# Patient Record
Sex: Female | Born: 2005 | Race: White | Hispanic: No | Marital: Single | Smoking: Never smoker
Health system: Southern US, Community
[De-identification: ages and names within clinical notes are randomized; demographics above are authoritative.]

## PROBLEM LIST (undated history)

## (undated) HISTORY — PX: DENTAL SURGERY: SHX609

---
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
## 2023-01-11 DIAGNOSIS — N3091 Cystitis, unspecified with hematuria: Secondary | ICD-10-CM | POA: Diagnosis not present

## 2023-01-11 DIAGNOSIS — N3001 Acute cystitis with hematuria: Secondary | ICD-10-CM | POA: Diagnosis not present

## 2023-01-25 DIAGNOSIS — H6501 Acute serous otitis media, right ear: Secondary | ICD-10-CM | POA: Diagnosis not present

## 2023-02-01 ENCOUNTER — Other Ambulatory Visit (INDEPENDENT_AMBULATORY_CARE_PROVIDER_SITE_OTHER): Payer: Self-pay

## 2023-02-01 ENCOUNTER — Ambulatory Visit (INDEPENDENT_AMBULATORY_CARE_PROVIDER_SITE_OTHER): Payer: Self-pay | Admitting: Neurology

## 2023-02-17 DIAGNOSIS — H6501 Acute serous otitis media, right ear: Secondary | ICD-10-CM | POA: Diagnosis not present

## 2023-02-17 DIAGNOSIS — H9201 Otalgia, right ear: Secondary | ICD-10-CM | POA: Diagnosis not present

## 2023-02-20 ENCOUNTER — Ambulatory Visit (INDEPENDENT_AMBULATORY_CARE_PROVIDER_SITE_OTHER): Payer: Self-pay | Admitting: Neurology

## 2023-03-23 ENCOUNTER — Telehealth (INDEPENDENT_AMBULATORY_CARE_PROVIDER_SITE_OTHER): Payer: Self-pay | Admitting: Neurology

## 2023-03-23 NOTE — Telephone Encounter (Signed)
Pharmacy was processing the wrong RX.  Error has been corrected on their end.  Contacted patient's mother to inform her of this. Mom verbalized understanding of this.  SS, CCMA

## 2023-03-23 NOTE — Telephone Encounter (Signed)
  Name of who is calling: Social research officer, government Relationship to Patient: Mom  Best contact number: [phone]  Provider they see: Dr. Merri Brunette  Reason for call: Pt needs refill on Kepra 500mg .      PRESCRIPTION REFILL ONLY  Name of prescription: Kepra 500mg    Pharmacy: [facility] in [place]

## 2023-04-04 DIAGNOSIS — H9201 Otalgia, right ear: Secondary | ICD-10-CM | POA: Diagnosis not present

## 2023-07-10 ENCOUNTER — Telehealth (INDEPENDENT_AMBULATORY_CARE_PROVIDER_SITE_OTHER): Payer: Self-pay

## 2023-07-10 NOTE — Telephone Encounter (Signed)
Form requesting Levetiracetam refill for a 90 d supply Last OV 12/11/2022 Next OV 08/13/2023 Rx written 12/11/22 with 8 refills to last until Nov appt.  Fax returned and advised will refill at Nov Ov if appropriate.

## 2023-07-29 ENCOUNTER — Emergency Department (HOSPITAL_COMMUNITY): Payer: BC Managed Care – PPO

## 2023-07-29 ENCOUNTER — Other Ambulatory Visit: Payer: Self-pay

## 2023-07-29 ENCOUNTER — Emergency Department (HOSPITAL_COMMUNITY)
Admission: EM | Admit: 2023-07-29 | Discharge: 2023-07-29 | Disposition: A | Discharge status: Home / Self Care | Payer: BC Managed Care – PPO | Attending: Emergency Medicine | Admitting: Emergency Medicine

## 2023-07-29 ENCOUNTER — Encounter (HOSPITAL_COMMUNITY): Payer: Self-pay | Admitting: Emergency Medicine

## 2023-07-29 DIAGNOSIS — R1084 Generalized abdominal pain: Secondary | ICD-10-CM | POA: Insufficient documentation

## 2023-07-29 DIAGNOSIS — R109 Unspecified abdominal pain: Secondary | ICD-10-CM | POA: Diagnosis not present

## 2023-07-29 LAB — COMPREHENSIVE METABOLIC PANEL
ALT: 13 U/L (ref 0–44)
AST: 20 U/L (ref 15–41)
Albumin: 4.6 g/dL (ref 3.5–5.0)
Alkaline Phosphatase: 45 U/L (ref 38–126)
Anion gap: 7 (ref 5–15)
BUN: 7 mg/dL (ref 6–20)
CO2: 22 mmol/L (ref 22–32)
Calcium: 9 mg/dL (ref 8.9–10.3)
Chloride: 110 mmol/L (ref 98–111)
Creatinine, Ser: 0.64 mg/dL (ref 0.44–1.00)
GFR, Estimated: >60 mL/min (ref >60)
Glucose, Bld: 102 mg/dL — ABNORMAL HIGH (ref 70–99)
Potassium: 3.7 mmol/L (ref 3.5–5.1)
Sodium: 139 mmol/L (ref 135–145)
Total Bilirubin: 0.4 mg/dL (ref 0.3–1.2)
Total Protein: 8 g/dL (ref 6.5–8.1)

## 2023-07-29 LAB — LIPASE, BLOOD: Lipase: 66 U/L — ABNORMAL HIGH (ref 11–51)

## 2023-07-29 LAB — URINALYSIS, ROUTINE W REFLEX MICROSCOPIC
Bilirubin Urine: NEGATIVE
Glucose, UA: NEGATIVE mg/dL
Hgb urine dipstick: NEGATIVE
Ketones, ur: NEGATIVE mg/dL
Leukocytes,Ua: NEGATIVE
Nitrite: NEGATIVE
Protein, ur: NEGATIVE mg/dL
Specific Gravity, Urine: 1.002 — ABNORMAL LOW (ref 1.005–1.030)
pH: 6 (ref 5.0–8.0)

## 2023-07-29 LAB — CBC
HCT: 43.1 % (ref 36.0–46.0)
Hemoglobin: 14.5 g/dL (ref 12.0–15.0)
MCH: 29.9 pg (ref 26.0–34.0)
MCHC: 33.6 g/dL (ref 30.0–36.0)
MCV: 88.9 fL (ref 80.0–100.0)
Platelets: 243 10*3/uL (ref 150–400)
RBC: 4.85 MIL/uL (ref 3.87–5.11)
RDW: 11.9 % (ref 11.5–15.5)
WBC: 5.9 10*3/uL (ref 4.0–10.5)
nRBC: 0 % (ref 0.0–0.2)

## 2023-07-29 LAB — HCG, SERUM, QUALITATIVE: Preg, Serum: NEGATIVE

## 2023-07-29 MED ORDER — IOHEXOL 300 MG/ML  SOLN
100.0000 mL | Freq: Once | INTRAMUSCULAR | Status: AC | PRN
  Administered 2023-07-29: 100 mL via INTRAVENOUS

## 2023-07-29 NOTE — ED Triage Notes (Signed)
Pt presents to ED with c/o generalized abd pain that began 1-2hrs ago, pt reports that pain started as a sharp pain around her umbilicus and radiated to her left side and back, then became generalized. Pt applied heat and did not get relief. Started Omeprazole for GERD on 10/29. Rates pain 8/10. Denies N/V, diarrhea, or any additional symptoms.

## 2023-07-29 NOTE — ED Provider Notes (Signed)
[facility] AT [facility] Provider Note   CSN: [id number] Arrival date & time: 07/29/23  0316     History  Chief Complaint  Patient presents with   Abdominal Pain    Veronica Trevino is a 17 y.o. female.  Patient with history of GERD recently started on omeprazole presents today with complaints of abdominal pain.  She states that same began pain was initially sharp around her umbilicus and radiated to the left side.  Denies any nausea, vomiting, or diarrhea.  She is having regular bowel movements.  No history of similar symptoms previously.  No history of abdominal surgeries.  She tried a heating pad without any relief.  Presents for same.  Unfortunately, due to extended wait times patient was in the lobby for approximately 5 hours before being placed in a room.  States that soon after she arrived here her pain completely resolved and has not returned.  She is back to her normal.  Denies any urinary symptoms.  The history is provided by the patient. No language interpreter was used.  Abdominal Pain      Home Medications Prior to Admission medications   Medication Sig Start Date End Date Taking? Authorizing Provider  ipratropium (ATROVENT) 0.06 % nasal spray Place 2 sprays into both nostrils 3 (three) times daily. Patient not taking: Reported on 12/11/2022 12/05/22   [provider]  LARIN 24 FE 1-20 MG-MCG(24) tablet Take 1 tablet by mouth daily. 07/14/22   [provider]  levETIRAcetam (KEPPRA) 500 MG tablet 1 Tablet in AM and 2 Tablets in Evening. 12/11/22   Keturah Shavers, MD  NAYZILAM 5 MG/0.1ML SOLN Apply 5 mg nasally for seizures lasting longer than 5 minutes. 02/10/22   Keturah Shavers, MD      Allergies    Patient has no known allergies.    Review of Systems   Review of Systems  Gastrointestinal:  Positive for abdominal pain.  All other systems reviewed and are negative.   Physical Exam Updated Vital Signs BP 100/75    Pulse 81   Temp 98.5 F (36.9 C) (Oral)   Resp 14   Ht 5\' 3"  (1.6 m)   Wt 47.6 kg   LMP 07/09/2023 (Exact Date)   SpO2 100%   BMI 18.60 kg/m  Physical Exam Vitals and nursing note reviewed.  Constitutional:      General: She is not in acute distress.    Appearance: Normal appearance. She is normal weight. She is not ill-appearing, toxic-appearing or diaphoretic.  HENT:     Head: Normocephalic and atraumatic.  Cardiovascular:     Rate and Rhythm: Normal rate.  Pulmonary:     Effort: Pulmonary effort is normal. No respiratory distress.  Abdominal:     General: Abdomen is flat.     Palpations: Abdomen is soft.     Tenderness: There is no abdominal tenderness.  Musculoskeletal:        General: Normal range of motion.     Cervical back: Normal range of motion.  Skin:    General: Skin is warm and dry.  Neurological:     General: No focal deficit present.     Mental Status: She is alert.  Psychiatric:        Mood and Affect: Mood normal.        Behavior: Behavior normal.     ED Results / Procedures / Treatments   Labs (all labs ordered are listed, but only abnormal results are displayed)  Labs Reviewed  LIPASE, BLOOD - Abnormal; Notable for the following components:      Result Value   Lipase 66 (*)    All other components within normal limits  COMPREHENSIVE METABOLIC PANEL - Abnormal; Notable for the following components:   Glucose, Bld 102 (*)    All other components within normal limits  URINALYSIS, ROUTINE W REFLEX MICROSCOPIC - Abnormal; Notable for the following components:   Color, Urine COLORLESS (*)    Specific Gravity, Urine 1.002 (*)    All other components within normal limits  CBC  HCG, SERUM, QUALITATIVE    EKG None  Radiology CT ABDOMEN PELVIS W CONTRAST  Result Date: 07/29/2023 CLINICAL DATA:  Acute, nonlocalized abdominal pain EXAM: CT ABDOMEN AND PELVIS WITH CONTRAST TECHNIQUE: Multidetector CT imaging of the abdomen and pelvis was performed  using the standard protocol following bolus administration of intravenous contrast. RADIATION DOSE REDUCTION: This exam was performed according to the departmental dose-optimization program which includes automated exposure control, adjustment of the mA and/or kV according to patient size and/or use of iterative reconstruction technique. CONTRAST:  OMNIPAQUE IOHEXOL 300 MG/ML  SOLN COMPARISON:  None Available. FINDINGS: Lower chest:  No contributory findings. Hepatobiliary: No focal liver abnormality.No evidence of biliary obstruction or stone. Pancreas: Unremarkable. Spleen: Unremarkable. Adrenals/Urinary Tract: Negative adrenals. No hydronephrosis or stone. Unremarkable bladder. Stomach/Bowel: No obstruction. The appendix is difficult to visualize separate from adjacent small bowel loops, but no indication of appendicitis. Vascular/Lymphatic: No acute vascular abnormality. No mass or adenopathy. Reproductive:No pathologic findings. Other: No ascites or pneumoperitoneum. Musculoskeletal: No acute abnormalities. IMPRESSION: Negative abdominal CT.  No explanation for symptoms. Electronically Signed   By: Tiburcio Pea M.D.   On: 07/29/2023 05:55    Procedures Procedures    Medications Ordered in ED Medications  iohexol (OMNIPAQUE) 300 MG/ML solution 100 mL (100 mLs Intravenous Contrast Given 07/29/23 0534)    ED Course/ Medical Decision Making/ A&P                                 Medical Decision Making Amount and/or Complexity of Data Reviewed Labs: ordered.   This patient is a 17 y.o. female who presents to the ED for concern of abdominal pain, this involves an extensive number of treatment options, and is a complaint that carries with it a high risk of complications and morbidity. The emergent differential diagnosis prior to evaluation includes, but is not limited to,  gastroenteritis, appendicitis, Bowel obstruction, Bowel perforation. Gastroparesis, DKA, Hernia, Inflammatory bowel  disease, mesenteric ischemia, pancreatitis, peritonitis SBP, volvulus.  This is not an exhaustive differential.   Past Medical History / Co-morbidities / Social History:  has a past medical history of Seizure disorder ([facility]) (01/15/2022).  Additional history: Chart reviewed.  Physical Exam: Physical exam performed. The pertinent findings include: Abdomen soft and nontender  Lab Tests: I ordered, and personally interpreted labs.  The pertinent results include:  Lipase 66, no other acute laboratory abnormalities   Imaging Studies: I ordered imaging studies including CT abdomen pelvis. I independently visualized and interpreted imaging which showed   Negative abdominal CT. No explanation for symptoms.   I agree with the radiologist interpretation.     Medications: Considered medications for symptoms, however upon assessment patient's pain had already completely resolved.  Disposition: After consideration of the diagnostic results and the patients response to treatment, I feel that emergency department workup does not suggest an emergent  condition requiring admission or immediate intervention beyond what has been performed at this time. The plan is: Discharge with close outpatient follow-up and return precautions. Patient is nontoxic, nonseptic appearing, in no apparent distress.  Prior to my evaluation, patient has been asymptomatic for several hours.  Labs, imaging and vitals reviewed.  Patient does not meet the SIRS or Sepsis criteria.   She feels ready to go home.  No medications needed.  Evaluation and diagnostic testing in the emergency department does not suggest an emergent condition requiring admission or immediate intervention beyond what has been performed at this time.  Plan for discharge with close PCP follow-up.  Patient is understanding and amenable with plan, educated on red flag symptoms that would prompt immediate return.  Patient discharged in stable condition.  Final  Clinical Impression(s) / ED Diagnoses Final diagnoses:  Generalized abdominal pain    Rx / DC Orders ED Discharge Orders     None     An After Visit Summary was printed and given to the patient.     Vear Clock 07/29/23 7829    Tegeler, Canary Brim, MD 07/29/23 1536

## 2023-07-29 NOTE — Discharge Instructions (Signed)
As we discussed, your workup in the ER today was reassuring for acute findings.  Laboratory evaluation and CT imaging did not reveal any emergent cause of your symptoms.  Given that your symptoms have resolved, no additional evaluation is indicated.  I recommend that you follow-up closely with your primary doctor.   Return if development of any new or worsening symptoms.

## 2023-08-09 DIAGNOSIS — R1084 Generalized abdominal pain: Secondary | ICD-10-CM | POA: Diagnosis not present

## 2023-08-13 ENCOUNTER — Ambulatory Visit (INDEPENDENT_AMBULATORY_CARE_PROVIDER_SITE_OTHER): Payer: BC Managed Care – PPO | Admitting: Neurology

## 2023-08-13 ENCOUNTER — Encounter (INDEPENDENT_AMBULATORY_CARE_PROVIDER_SITE_OTHER): Payer: Self-pay | Admitting: Neurology

## 2023-08-13 VITALS — BP 118/60 | HR 62 | Ht 63.58 in | Wt 106.5 lb

## 2023-08-13 DIAGNOSIS — R55 Syncope and collapse: Secondary | ICD-10-CM

## 2023-08-13 DIAGNOSIS — G40909 Epilepsy, unspecified, not intractable, without status epilepticus: Secondary | ICD-10-CM | POA: Diagnosis not present

## 2023-08-13 MED ORDER — LEVETIRACETAM 500 MG PO TABS: ORAL_TABLET | ORAL | 2 refills | Status: DC

## 2023-08-13 NOTE — Patient Instructions (Signed)
Continue the same dose of Keppra at 500 mg in a.m. and 1000 mg in p.m. Continue with adequate sleep and limited screen time Follow-up with your primary care physician for increased lipase Call my office if there is any seizure activity We will schedule for sleep deprived EEG at the same time with next visit Return in 7 months for follow-up visit

## 2023-08-13 NOTE — Progress Notes (Signed)
Patient: Veronica Trevino MRN: [id number] Sex: female DOB: 06/09/2006  Provider: Keturah Shavers, MD Location of Care: [facility]  Note type: Routine return visit  Referral Source: PCP History from: patient, [facility] chart, and MOM AND DAD Chief Complaint: Seizure disorder (HCC   History of Present Illness: Veronica Trevino is a 17 y.o. female is here for follow-up management of seizure disorder. 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  She was also having episodes which more look like to be vasovagal event with the last episode was prior to the last visit in March. She has been on Keppra with moderate dose and with fairly good seizure control and tolerating medication well with no side effects, currently 500 mg in a.m. and 1000 mg in p.m. Her last EEG in March 2024 was slightly abnormal with 1 brief generalized sharply contoured waves She did have a normal head CT in April 2023 Overall since her last visit in March she has been doing very well without having any episodes concerning for seizure activity or vasovagal event and she has been taking Keppra regularly without any missing doses.   She has had some abdominal pain for which she was seen in emergency room and had slight elevation of lipase which repeated again and it was slightly higher at around 90 as per parents.  She has not had any abdominal pain over the past few days.  Review of Systems: Review of system as per HPI, otherwise negative.  Past Medical History:  Diagnosis Date   Seizure disorder (HCC) 01/15/2022   1 witnessed generalized tonic-clonic seizure with abnormal EEG.  Now on Keppra.   Hospitalizations: No., Head Injury: No., Nervous System Infections: No., Immunizations up to date: Yes.     Surgical History Past Surgical History:  Procedure Laterality Date    DENTAL SURGERY      Family History family history includes Healthy in her father; Peripheral Artery Disease in her mother; Pulmonary Hypertension in her mother; Seizures in her brother; Supraventricular tachycardia in her mother.   Social History Social History   Socioeconomic History   Marital status: Single    Spouse name: Not on file   Number of children: Not on file   Years of education: Not on file   Highest education level: High school graduate  Occupational History   Not on file  Tobacco Use   Smoking status: Never    Passive exposure: Never   Smokeless tobacco: Never  Vaping Use   Vaping status: Never Used  Substance and Sexual Activity   Alcohol use: Never   Drug use: Never   Sexual activity: Never  Other Topics Concern   Not on file  Social History Narrative   Graduated (2023), [employer]   Patient lives with: Mom, Step Father. 2 Brothers and Sister      What are the patient's hobbies or interest? Reading.    No Employment.           Social Determinants of Health   Financial Resource Strain: Not on file  Food Insecurity: Low Risk  (04/04/2023)   Received from [facility]   Hunger Vital Sign    Worried About Running Out of Food in the Last Year: Never true    Ran Out of Food in the Last Year: Never true  Transportation Needs: Not on file (04/04/2023)  Physical Activity: Not on file  Stress: Not on file  Social Connections: Not on file     No Known Allergies  Physical Exam BP 118/60   Pulse 62   Ht 5' 3.58" (1.615 m)   Wt 106 lb 7.7 oz (48.3 kg)   LMP 08/10/2023   BMI 18.52 kg/m  Gen: Awake, alert, not in distress Skin: No rash, No neurocutaneous stigmata. HEENT: Normocephalic, no dysmorphic features, no conjunctival injection, nares patent, mucous membranes moist, oropharynx clear. Neck: Supple, no meningismus. No focal tenderness. Resp: Clear to auscultation bilaterally CV: Regular rate, normal S1/S2, no murmurs, no  rubs Abd: BS present, abdomen soft, non-tender, non-distended. No hepatosplenomegaly or mass Ext: Warm and well-perfused. No deformities, no muscle wasting, ROM full.  Neurological Examination: MS: Awake, alert, interactive. Normal eye contact, answered the questions appropriately, speech was fluent,  Normal comprehension.  Attention and concentration were normal. Cranial Nerves: Pupils were equal and reactive to light ( 5-54mm);  normal fundoscopic exam with sharp discs, visual field full with confrontation test; EOM normal, no nystagmus; no ptsosis, no double vision, intact facial sensation, face symmetric with full strength of facial muscles, hearing intact to finger rub bilaterally, palate elevation is symmetric, tongue protrusion is symmetric with full movement to both sides.  Sternocleidomastoid and trapezius are with normal strength. Tone-Normal Strength-Normal strength in all muscle groups DTRs-  Biceps Triceps Brachioradialis Patellar Ankle  R 2+ 2+ 2+ 2+ 2+  L 2+ 2+ 2+ 2+ 2+   Plantar responses flexor bilaterally, no clonus noted Sensation: Intact to light touch, temperature, vibration, Romberg negative. Coordination: No dysmetria on FTN test. No difficulty with balance. Gait: Normal walk and run. Tandem gait was normal. Was able to perform toe walking and heel walking without difficulty.   Assessment and Plan 1. Seizure disorder (HCC)   2. Vasovagal episode    This is a 17 year old female with diagnosis of generalized seizure disorder since 2023, currently on moderate dose of Keppra with good seizure control and no side effects.  She has no other medical issues although she did have some abdominal pain with slight elevation of lipase. Recommend to continue the same dose of Keppra at 500 mg in a.m. and 1000 mg in p.m. She will continue with adequate sleep and limited screen time She will continue follow-up with primary care physician to follow-up on increased lipase for further  testing or referral to GI service We will schedule for a follow-up EEG with the next appointment If she continues to be seizure-free and her next EEG is normal then we may consider tapering and discontinue medication I would like to see her in 7 months for follow-up visit and we will discuss further treatment based on the clinical response and next EEG report.  She and both parents understood and agreed with the plan.  Meds ordered this encounter  Medications   levETIRAcetam (KEPPRA) 500 MG tablet    Sig: 1 Tablet in AM and 2 Tablets in Evening.    Dispense:  270 tablet    Refill:  2   Orders Placed This Encounter  Procedures   Child sleep deprived EEG    Standing Status:   Future    Standing Expiration Date:   08/12/2024    Scheduling Instructions:     To be done at the same time with a next appointment in 7 months    Order Specific Question:   Where should this test be performed?    Answer:   [facility]

## 2023-08-27 ENCOUNTER — Other Ambulatory Visit (INDEPENDENT_AMBULATORY_CARE_PROVIDER_SITE_OTHER): Payer: Self-pay | Admitting: Neurology

## 2023-08-27 MED ORDER — NAYZILAM 5 MG/0.1ML NA SOLN: NASAL | 2 refills | Status: AC

## 2023-08-27 NOTE — Telephone Encounter (Signed)
  Name of who is calling: Veronica Trevino   Caller's Relationship to Patient:   Best contact number: [phone]  Provider they see: Dr. Merri Brunette  Reason for call: Pt called in stating she needs med refill on emergency seizure medication, I asked her if it was the nose spray and she said she thinks so, does not know the name of the medication. Please send to [facility] on [address]     PRESCRIPTION REFILL ONLY  Name of prescription:  Pharmacy:

## 2023-08-28 ENCOUNTER — Encounter (INDEPENDENT_AMBULATORY_CARE_PROVIDER_SITE_OTHER): Payer: Self-pay

## 2023-08-28 NOTE — Progress Notes (Signed)
Nayzilam Outcome Approved today by [employer] 2017 CaseId:[id number];Status:Approved;Review Type:Prior Auth;Coverage Start Date:07/29/2023;Coverage End Date:08/27/2024; Authorization Expiration Date: 08/26/2024

## 2023-09-10 ENCOUNTER — Encounter (INDEPENDENT_AMBULATORY_CARE_PROVIDER_SITE_OTHER): Payer: BC Managed Care – PPO | Admitting: Family

## 2023-10-12 ENCOUNTER — Telehealth (INDEPENDENT_AMBULATORY_CARE_PROVIDER_SITE_OTHER): Payer: Self-pay | Admitting: Neurology

## 2023-10-12 NOTE — Telephone Encounter (Signed)
Who's calling (name and relationship to patient) : Veronica Trevino; self   Best contact number: [phone]  Provider they see: Dr. Merri Brunette  Reason for call: Concepsion was calling in wanting to know if she can take herbal substance with the medication that she is on. Its  for gas and bloat relief; with Peppermint leaves, lemon, and sennel. She is requesting a call back.    Call ID:      PRESCRIPTION REFILL ONLY  Name of prescription:  Pharmacy:

## 2023-10-12 NOTE — Telephone Encounter (Signed)
Called Lalisa to let her know I received her message and I will be sending it over to Dr. Merri Brunette to see what he will say.  She understood message

## 2023-10-15 NOTE — Telephone Encounter (Signed)
Left message stating to give me a call back

## 2023-10-15 NOTE — Telephone Encounter (Signed)
Called Veronica Trevino to let her know per Dr. Merri Brunette hes not able to comment on any herbal medications or supplements since there is no study for that but overall she can try it for a few days and see if there would be any symptoms or intolerance otherwise she can take it but again we do not know specific benefits or side effects of these supplements.   Veronica Trevino understood message

## 2023-10-15 NOTE — Telephone Encounter (Signed)
Veronica Trevino is returning a callback from a voicemail that she reciceved and is requesting a callback at [phone].

## 2023-12-17 IMAGING — DX DG CHEST 1V PORT
1 series · 1 of 1 positions shown · non-contrast
Comparison: 03/14/2020

CLINICAL DATA: Status post fall with chest pain.

EXAM:
PORTABLE CHEST 1 VIEW

[chest]
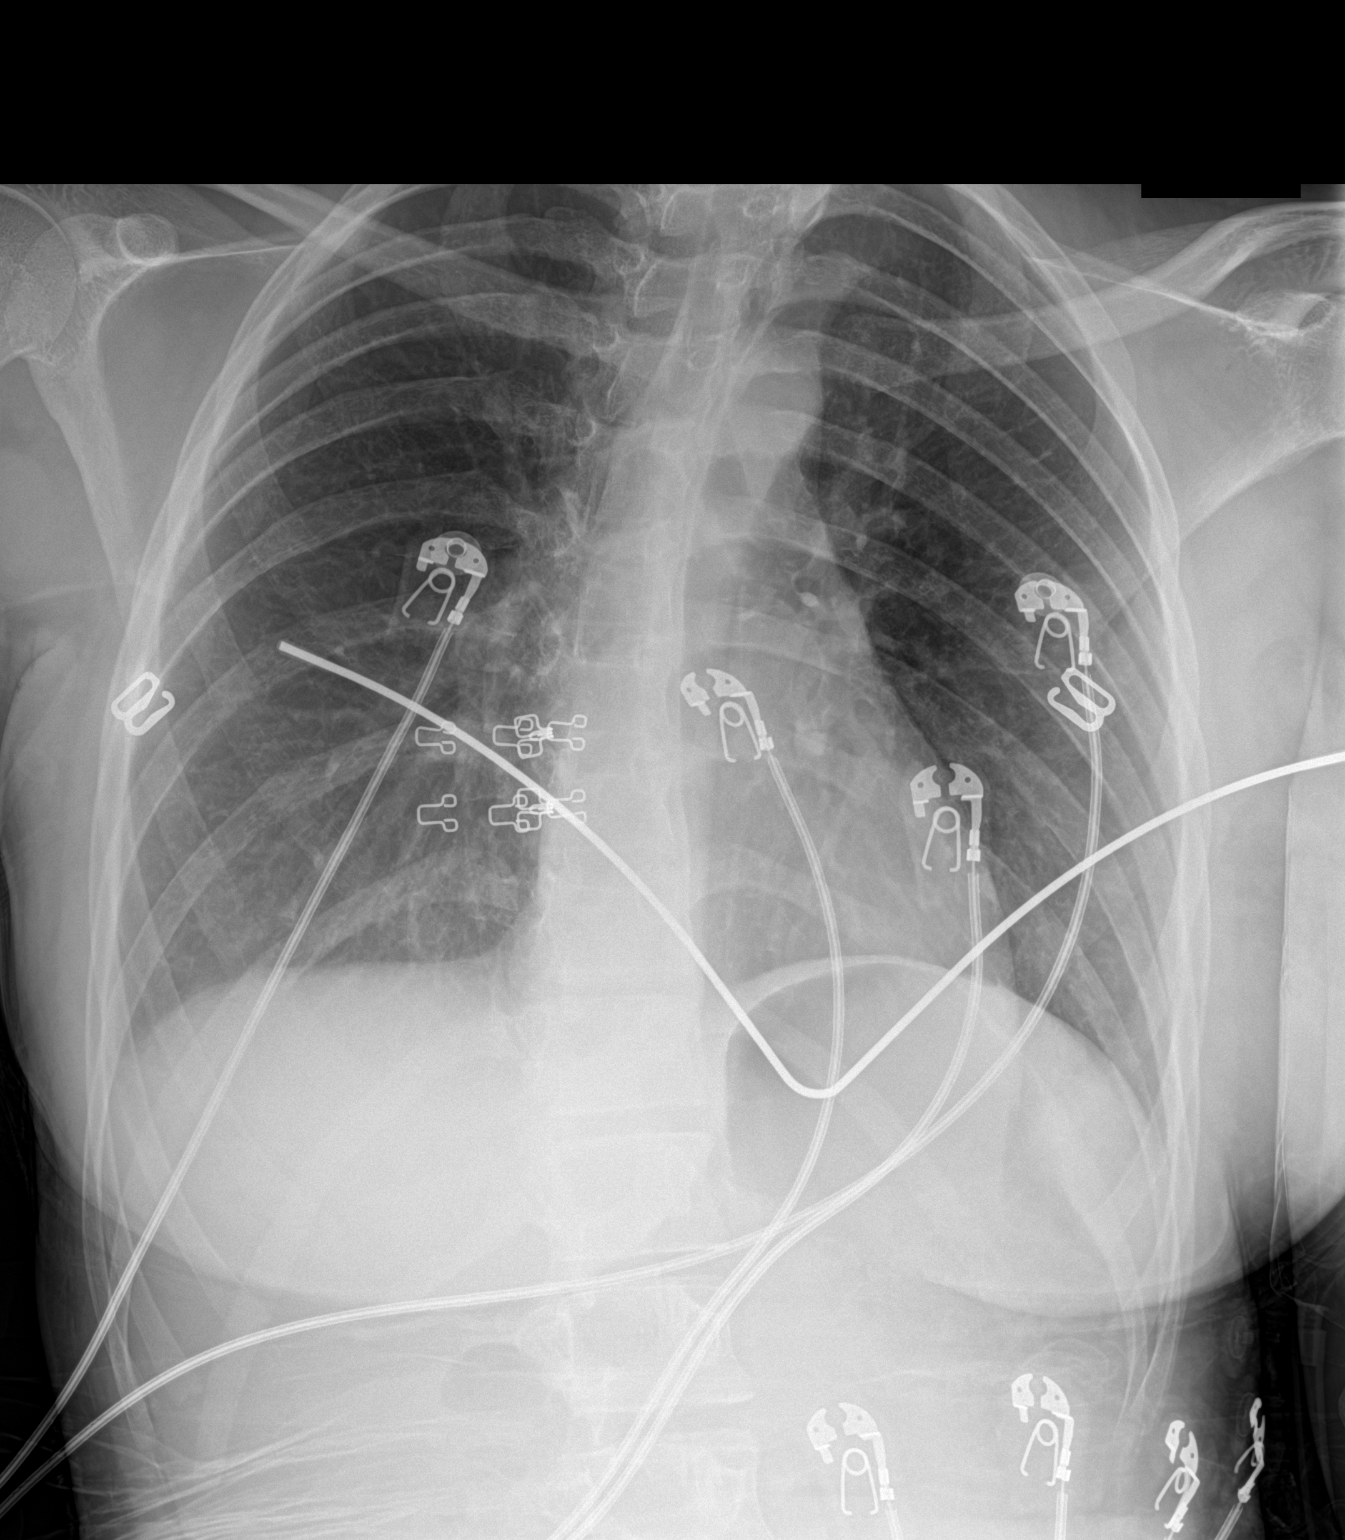

[1 of 1 positions shown; findings below may reference images not displayed]

FINDINGS: Cardiomediastinal silhouette is normal. Mediastinal contours appear
intact.

There is no evidence of focal airspace consolidation, pleural
effusion or pneumothorax.

Osseous structures are without acute abnormality. Soft tissues are
grossly normal.
IMPRESSION: No active disease.

## 2023-12-17 IMAGING — CT CT HEAD W/O CM
3 series · 15 of 37 positions shown, 17 images · non-contrast
Comparison: None.

CLINICAL DATA: Head trauma.



[Series 2: head wo · axial · 0.31mm/px · z∈[-209,-126]mm · 6 of 30 slices shown, 8 images]
[im 5/30  brain]
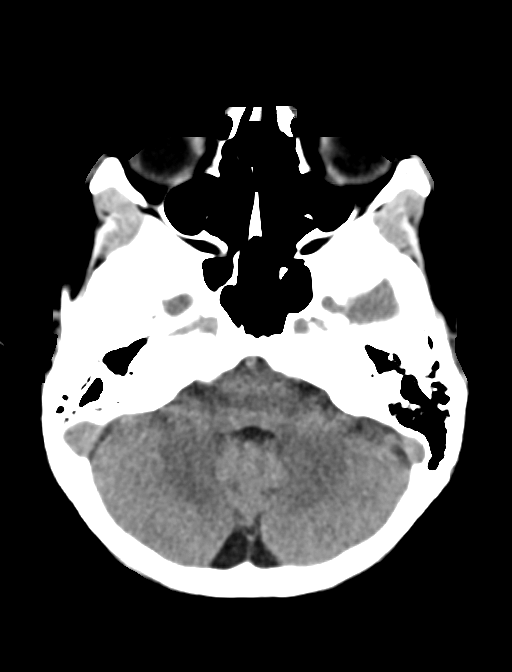
[im 5/30  bone]
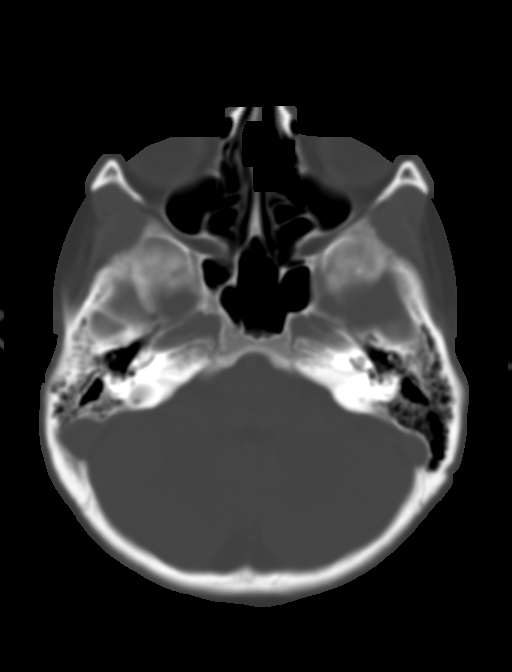
[im 9/30  brain]
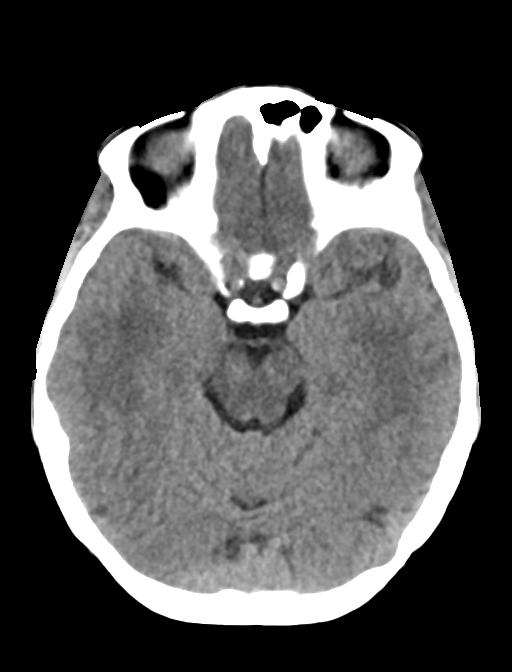
[im 13/30  brain]
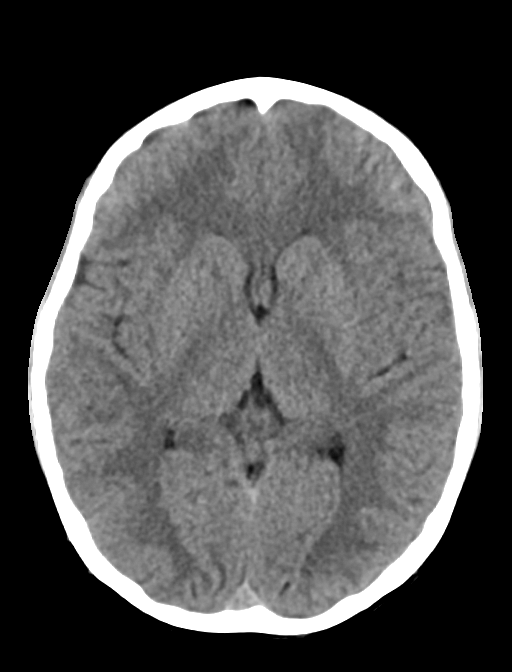
[im 17/30  brain]
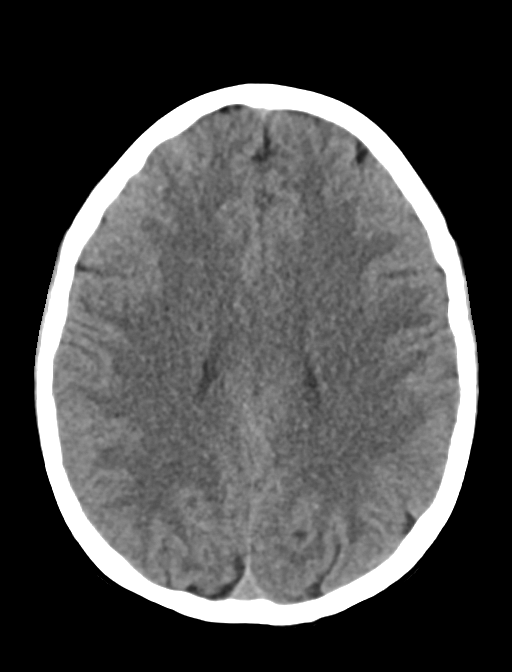
[im 21/30  brain]
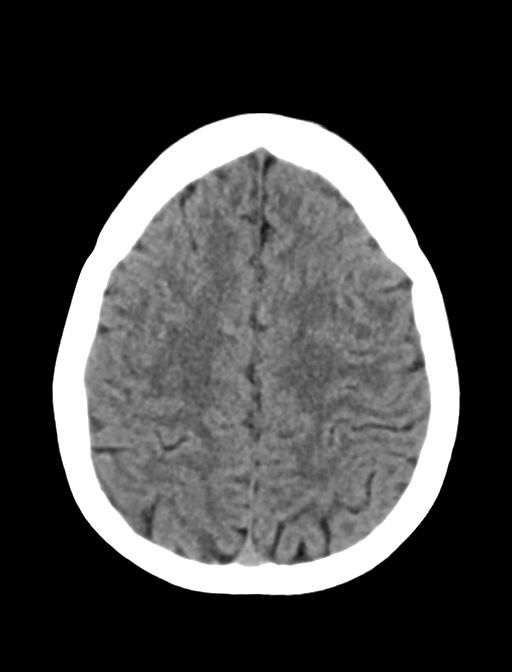
[im 21/30  bone]
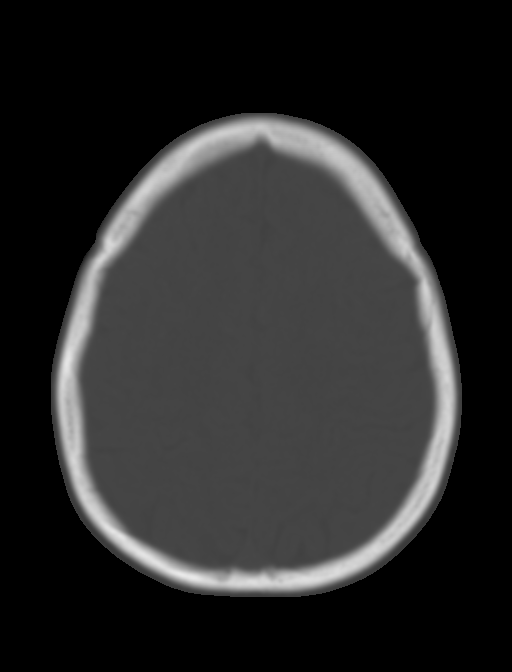
[im 25/30  brain]
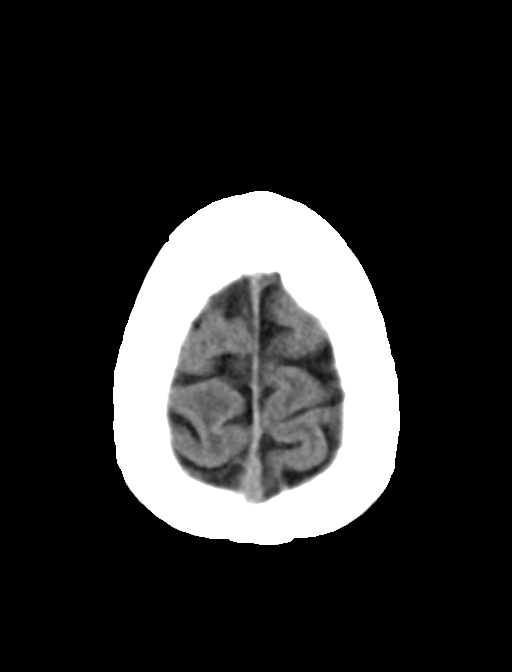

[Series 3: head bone · axial · 0.41mm/px · z∈[-172,-76]mm · 6 of 85 slices shown]
[im 9/85  bone]
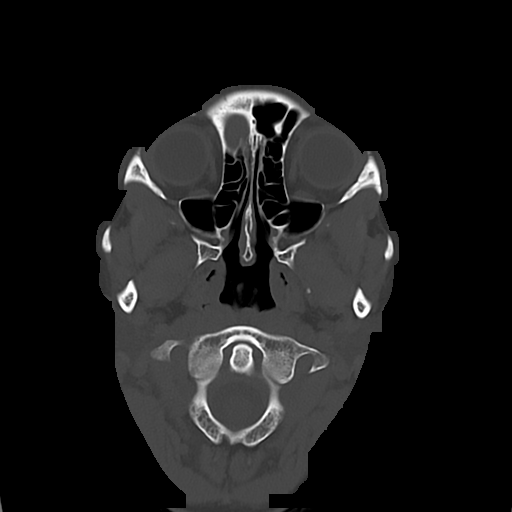
[im 17/85  bone]
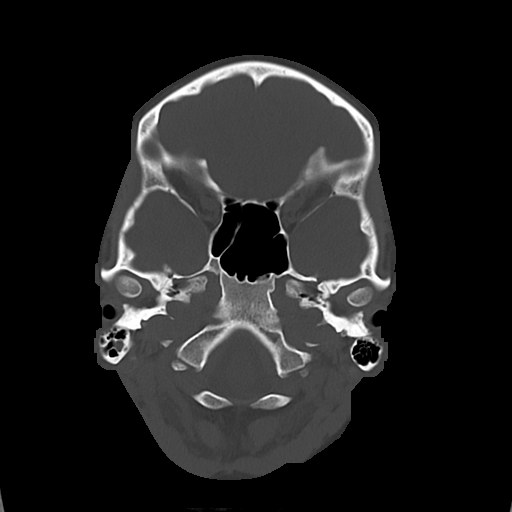
[im 29/85  bone]
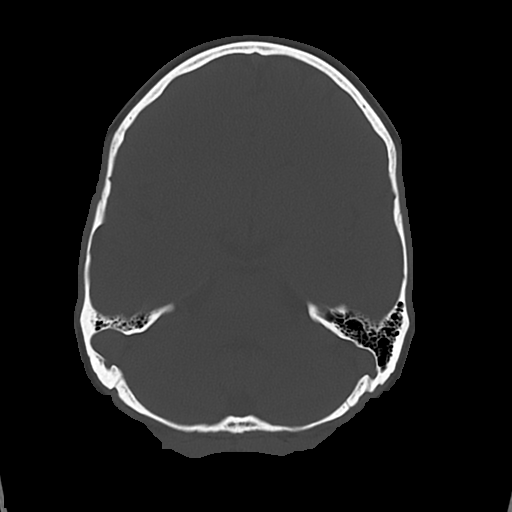
[im 37/85  bone]
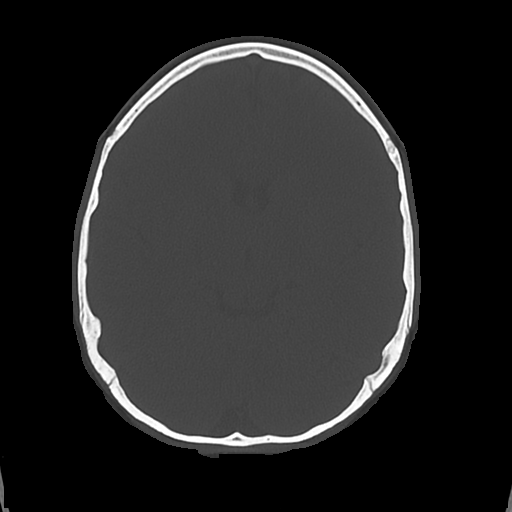
[im 49/85  bone]
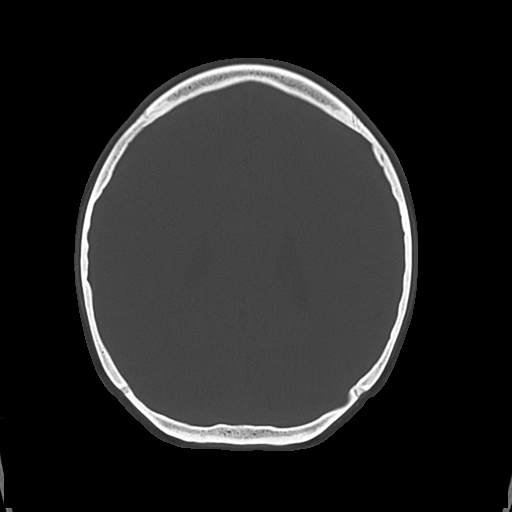
[im 57/85  bone]
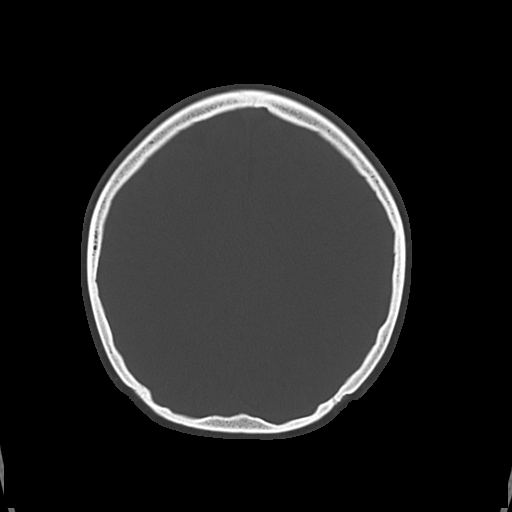

[Series 5: sag soft · sagittal · 0.33mm/px · 3 of 54 slices shown]
[im 18/54  brain]
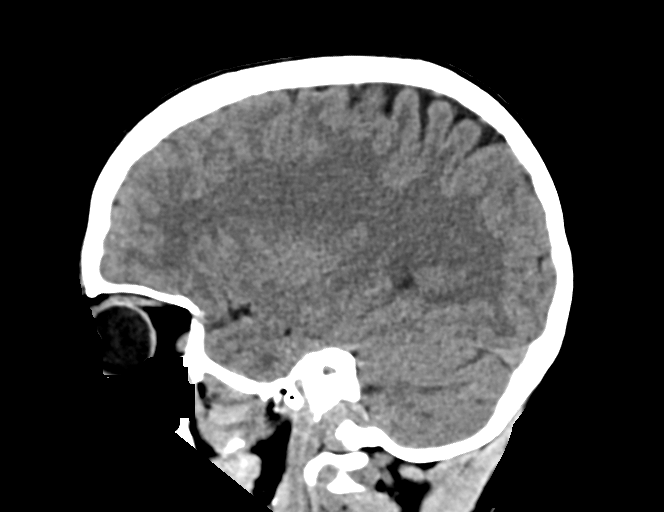
[im 27/54  brain]
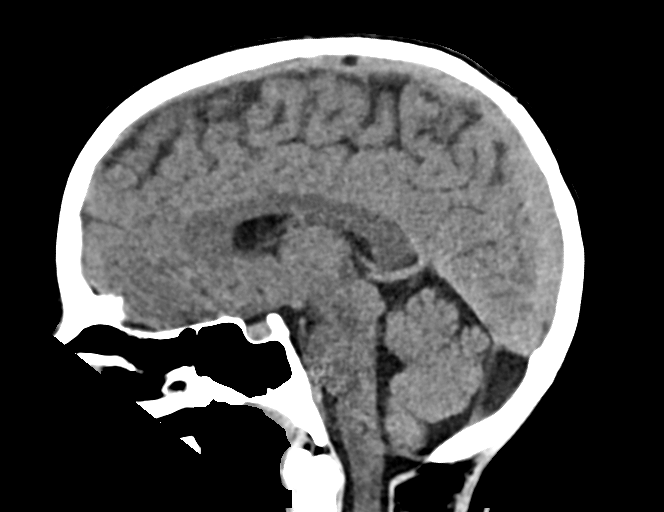
[im 36/54  brain]
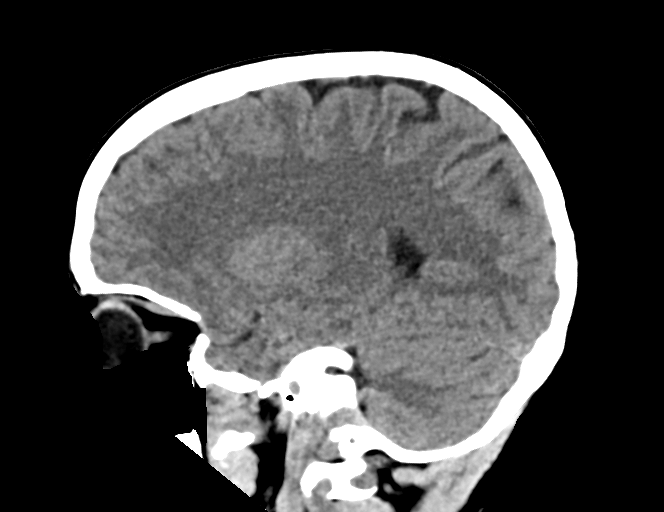

[15 of 37 positions shown; findings below may reference images not displayed]

FINDINGS: Brain: No evidence of acute infarction, hemorrhage, hydrocephalus,
extra-axial collection or mass lesion/mass effect.

Vascular: No hyperdense vessel or unexpected calcification.

Skull: Normal. Negative for fracture or focal lesion.

Sinuses/Orbits: No acute finding.

Other: None.
IMPRESSION: No acute intracranial abnormality.

## 2024-01-04 ENCOUNTER — Telehealth (INDEPENDENT_AMBULATORY_CARE_PROVIDER_SITE_OTHER): Payer: Self-pay | Admitting: Neurology

## 2024-01-04 NOTE — Telephone Encounter (Signed)
 Called Natoya stating Dr. Merri Brunette hasn't seen her for headache she can make appointment for it. Stay hydrated and take Advil 400mg  3-4 times a week.  She stated she sees him in June so she will mention headache then and be treated for it  Brianda and I understood message

## 2024-01-04 NOTE — Telephone Encounter (Signed)
 I called Veronica Trevino about having headaches. She says she's having daily headaches, it stops for a while and then comes back the very next day. She said she's been taking tylenol but no other headache meds and they dont seem to be helping. I let her know I would send message to Dr. Merri Brunette once he responds back to me on what she needs to do moving forward I will give her a call back with instructions.  Veronica Trevino understood message

## 2024-01-04 NOTE — Telephone Encounter (Signed)
  Name of who is calling: Jermeka   Caller's Relationship to Patient: self   Best contact number: [phone]  Provider they see: nab   Reason for call: a lot headaches recently, would like to speak with nurse or provider.      PRESCRIPTION REFILL ONLY  Name of prescription:  Pharmacy:

## 2024-02-15 ENCOUNTER — Emergency Department (HOSPITAL_COMMUNITY)
Admission: EM | Admit: 2024-02-15 | Discharge: 2024-02-15 | Disposition: A | Discharge status: Home / Self Care | Attending: Emergency Medicine | Admitting: Emergency Medicine

## 2024-02-15 ENCOUNTER — Other Ambulatory Visit: Payer: Self-pay

## 2024-02-15 ENCOUNTER — Emergency Department (HOSPITAL_COMMUNITY)

## 2024-02-15 DIAGNOSIS — R569 Unspecified convulsions: Secondary | ICD-10-CM | POA: Insufficient documentation

## 2024-02-15 DIAGNOSIS — R519 Headache, unspecified: Secondary | ICD-10-CM | POA: Diagnosis not present

## 2024-02-15 DIAGNOSIS — R42 Dizziness and giddiness: Secondary | ICD-10-CM | POA: Insufficient documentation

## 2024-02-15 DIAGNOSIS — R55 Syncope and collapse: Secondary | ICD-10-CM | POA: Insufficient documentation

## 2024-02-15 DIAGNOSIS — R29818 Other symptoms and signs involving the nervous system: Secondary | ICD-10-CM | POA: Diagnosis not present

## 2024-02-15 DIAGNOSIS — G40909 Epilepsy, unspecified, not intractable, without status epilepticus: Secondary | ICD-10-CM

## 2024-02-15 LAB — CBC
HCT: 44.1 % (ref 36.0–46.0)
Hemoglobin: 14.3 g/dL (ref 12.0–15.0)
MCH: 29.5 pg (ref 26.0–34.0)
MCHC: 32.4 g/dL (ref 30.0–36.0)
MCV: 91.1 fL (ref 80.0–100.0)
Platelets: 193 10*3/uL (ref 150–400)
RBC: 4.84 MIL/uL (ref 3.87–5.11)
RDW: 12.3 % (ref 11.5–15.5)
WBC: 8.7 10*3/uL (ref 4.0–10.5)
nRBC: 0 % (ref 0.0–0.2)

## 2024-02-15 LAB — HCG, SERUM, QUALITATIVE: Preg, Serum: NEGATIVE

## 2024-02-15 LAB — CBG MONITORING, ED: Glucose-Capillary: 61 mg/dL — ABNORMAL LOW (ref 70–99)

## 2024-02-15 MED ORDER — ACETAMINOPHEN 500 MG PO TABS
1000.0000 mg | ORAL_TABLET | Freq: Once | ORAL | Status: DC
  Filled 2024-02-15: qty 2

## 2024-02-15 MED ORDER — ONDANSETRON 4 MG PO TBDP: 4.0000 mg | ORAL_TABLET | ORAL | 0 refills | Status: DC | PRN

## 2024-02-15 MED ORDER — GADOBUTROL 1 MMOL/ML IV SOLN
5.0000 mL | Freq: Once | INTRAVENOUS | Status: AC | PRN
Start: 2024-02-15 — End: 2024-02-15
  Administered 2024-02-15: 5 mL via INTRAVENOUS

## 2024-02-15 MED ORDER — MECLIZINE HCL 25 MG PO TABS: 25.0000 mg | ORAL_TABLET | Freq: Three times a day (TID) | ORAL | 0 refills | Status: AC | PRN

## 2024-02-15 MED ORDER — MECLIZINE HCL 25 MG PO TABS: 25.0000 mg | ORAL_TABLET | Freq: Three times a day (TID) | ORAL | 0 refills | Status: DC | PRN

## 2024-02-15 NOTE — ED Notes (Signed)
Pt tx to MRI

## 2024-02-15 NOTE — Discharge Instructions (Addendum)
 1.  Follow-up with your neurologist and/or family doctor for recheck. 2.  At this time appears likely you have vertigo.  MRI did not show any evidence of a stroke, tumor or clot in your brain.  Vertigo can come and go.  You may try Antivert  (meclizine ) as prescribed for spinning like dizziness.  You may also take Zofran  for nausea if needed. 3.  Return to the emergency department if you have sudden changing or concerning symptoms such as double vision, passing out, weakness or numbness of your extremities or other concerning changes.

## 2024-02-15 NOTE — ED Notes (Signed)
 Lying: 128/73                96 Sitting: 124/86                69 Standing: 131/85                   84  Pt did get dizzy changing positions, did not feel like she was going to pass out of anything.

## 2024-02-15 NOTE — ED Triage Notes (Addendum)
 Pt BIBA from work. C/o sudden onset dizziness (room spinning) around 1430. Mild HA.  Hr 70-90s w/EMS  Aox4, no other focal deficits noted

## 2024-02-15 NOTE — ED Notes (Signed)
CBG 61. 

## 2024-02-15 NOTE — ED Notes (Signed)
 Pt remains in MRI

## 2024-02-15 NOTE — ED Provider Notes (Signed)
 [place] EMERGENCY DEPARTMENT AT [facility] Provider Note   CSN: [id number] Arrival date & time: 02/15/24  1505     History  Chief Complaint  Patient presents with   Dizziness    Veronica Trevino is a 18 y.o. female.  HPI She was at work.  She is a Solicitor at [employer].  She had been standing at the [employer] for a number of hours.  She reports that she had a sudden onset of feeling very dizzy.  She reports there was a spinning quality to the dizziness.  She denies any change in vision.  She did have onset of a diffuse headache which she qualifies as mild.  She reports it was not sudden onset of headache or severe.  She did not have focal weakness numbness or tingling of the extremities.  She reports she was able to ambulate but was off balance.  She did not fall or have a syncopal episode.  No chest pain or shortness of breath.  Patient reports she has recently been well without fevers or chills.  No recent cough nasal congestion or sinus pressure.  She does have a history of seizure disorder.  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  She denies that this is any typical presentation for prodrome for her.  She has no history of migraine headaches.  Patient takes Keppra  with which she is compliant.  She takes birth control pills.    Home Medications Prior to Admission medications   Medication Sig Start Date End Date Taking? Authorizing Provider  meclizine (ANTIVERT) 25 MG tablet Take 1 tablet (25 mg total) by mouth 3 (three) times daily as needed for dizziness. 02/15/24  Yes Wynetta Heckle, MD  ipratropium (ATROVENT) 0.06 % nasal spray Place 2 sprays into both nostrils 3 (three) times daily. Patient not taking: Reported on 12/11/2022 12/05/22   [provider]  LARIN 24 FE 1-20 MG-MCG(24) tablet Take 1 tablet by mouth daily. 07/14/22   [provider]  levETIRAcetam  (KEPPRA ) 500 MG tablet 1 Tablet in AM and 2 Tablets in Evening. 08/13/23    Ventura Gins, MD  NAYZILAM  5 MG/0.1ML SOLN Apply 5 mg nasally for seizures lasting longer than 5 minutes. 08/27/23   Ventura Gins, MD  omeprazole (PRILOSEC) 20 MG capsule Take 20 mg by mouth daily. 07/23/23   [provider]      Allergies    Patient has no known allergies.    Review of Systems   Review of Systems  Physical Exam Updated Vital Signs BP 121/85   Pulse 72   Temp 98 F (36.7 C)   Resp 18   Ht 5\' 3"  (1.6 m)   Wt 49.9 kg   SpO2 98%   BMI 19.49 kg/m  Physical Exam Constitutional:      Comments: Alert nontoxic.  Well-nourished well-developed.  No acute distress.  HENT:     Head: Normocephalic and atraumatic.     Right Ear: Tympanic membrane normal.     Left Ear: Tympanic membrane normal.     Mouth/Throat:     Mouth: Mucous membranes are moist.     Pharynx: Oropharynx is clear.  Eyes:     Extraocular Movements: Extraocular movements intact.     Conjunctiva/sclera: Conjunctivae normal.     Pupils: Pupils are equal, round, and reactive to light.  Cardiovascular:     Rate and Rhythm: Normal rate and regular rhythm.  Pulmonary:     Effort: Pulmonary effort is normal.  Breath sounds: Normal breath sounds.  Abdominal:     General: There is no distension.     Palpations: Abdomen is soft.     Tenderness: There is no abdominal tenderness. There is no guarding.  Musculoskeletal:        General: No swelling or tenderness. Normal range of motion.     Cervical back: Neck supple.     Right lower leg: No edema.     Left lower leg: No edema.  Skin:    General: Skin is warm and dry.  Neurological:     General: No focal deficit present.     Mental Status: She is oriented to person, place, and time.     Cranial Nerves: No cranial nerve deficit.     Sensory: No sensory deficit.     Motor: No weakness.     Coordination: Coordination normal.     Comments: Finger-nose exam intact bilaterally.  Normal heel shin exam.  Psychiatric:        Mood and Affect:  Mood normal.     ED Results / Procedures / Treatments   Labs (all labs ordered are listed, but only abnormal results are displayed) Labs Reviewed  CBG MONITORING, ED - Abnormal; Notable for the following components:      Result Value   Glucose-Capillary 61 (*)    All other components within normal limits  CBC  HCG, SERUM, QUALITATIVE  LEVETIRACETAM  LEVEL  BASIC METABOLIC PANEL WITH GFR    EKG EKG Interpretation Date/Time:  Friday Feb 15 2024 15:53:35 EDT Ventricular Rate:  64 PR Interval:  131 QRS Duration:  90 QT Interval:  396 QTC Calculation: 409 R Axis:   69  Text Interpretation: Sinus arrhythm lead placement voltage changes compared to previous, no significant change.  Confirmed by Wynetta Heckle [phone]) on 02/15/2024 4:20:59 PM  Radiology MR Brain W and Wo Contrast Result Date: 02/15/2024 CLINICAL DATA:  Neuro deficit, acute, stroke suspected; Dural venous sinus thrombosis suspected. EXAM: MRI HEAD WITHOUT AND WITH CONTRAST MRV HEAD WITHOUT CONTRAST TECHNIQUE: Multiplanar, multiecho pulse sequences of the brain and surrounding structures were obtained without and with intravenous contrast. Angiographic images of the intracranial venous structures were obtained using MRV technique without intravenous contrast. CONTRAST:  5mL GADAVIST GADOBUTROL 1 MMOL/ML IV SOLN COMPARISON:  CT head April 23, 23. FINDINGS: No evidence of acute infarct, acute hemorrhage, mass lesion, midline shift or hydrocephalus. No extra-axial fluid collection. No pathologic enhancement. Major arterial flow voids are maintained at the skull base. Normal marrow signal. Clear sinuses. No acute orbital findings. No mastoid effusions. No evidence of dural venous sinus thrombosis. Specifically, superior sagittal, transverse, sigmoid and straight sinuses are patent. Visualized deep cerebral veins are patent. Symmetric cavernous sinus opacification. IMPRESSION: 1. No evidence of acute intracranial abnormality. 2. No  evidence of dural venous sinus thrombosis. Electronically Signed   By: Stevenson Elbe M.D.   On: 02/15/2024 19:49   MR MRV HEAD W WO CONTRAST Result Date: 02/15/2024 CLINICAL DATA:  Neuro deficit, acute, stroke suspected; Dural venous sinus thrombosis suspected. EXAM: MRI HEAD WITHOUT AND WITH CONTRAST MRV HEAD WITHOUT CONTRAST TECHNIQUE: Multiplanar, multiecho pulse sequences of the brain and surrounding structures were obtained without and with intravenous contrast. Angiographic images of the intracranial venous structures were obtained using MRV technique without intravenous contrast. CONTRAST:  5mL GADAVIST GADOBUTROL 1 MMOL/ML IV SOLN COMPARISON:  CT head April 23, 23. FINDINGS: No evidence of acute infarct, acute hemorrhage, mass lesion, midline shift or hydrocephalus. No extra-axial  fluid collection. No pathologic enhancement. Major arterial flow voids are maintained at the skull base. Normal marrow signal. Clear sinuses. No acute orbital findings. No mastoid effusions. No evidence of dural venous sinus thrombosis. Specifically, superior sagittal, transverse, sigmoid and straight sinuses are patent. Visualized deep cerebral veins are patent. Symmetric cavernous sinus opacification. IMPRESSION: 1. No evidence of acute intracranial abnormality. 2. No evidence of dural venous sinus thrombosis. Electronically Signed   By: Stevenson Elbe M.D.   On: 02/15/2024 19:49    Procedures Procedures    Medications Ordered in ED Medications  acetaminophen (TYLENOL) tablet 1,000 mg (1,000 mg Oral Not Given 02/15/24 1646)  gadobutrol (GADAVIST) 1 MMOL/ML injection 5 mL (5 mLs Intravenous Contrast Given 02/15/24 1830)    ED Course/ Medical Decision Making/ A&P                                 Medical Decision Making Amount and/or Complexity of Data Reviewed Labs: ordered. Radiology: ordered.  Risk OTC drugs. Prescription drug management.   Patient presents as outlined with a sudden onset of  vertigo and diffuse headache.  Medical history includes seizure disorder but symptoms not typical for the patient of any seizure prodrome.  Patient takes Keppra  and oral birth control.  Differential diagnosis includes cerebellar stroke\intracranial mass or tumor\MS\seizure prodrome\atypical migraine\cavernous sinus thrombosis.  Proceed with basic lab work and MRI imaging.  hCG negative CBC normal CBG 61  MRI MRV brain negative acute findings by radiology interpretation.  At this time diagnostic workup stable without evidence of cavernous sinus thrombosis\CVA or other neurologic disorder.  Will plan for outpatient follow-up with neurology.  Careful return precautions reviewed.  At this time I have described peripheral vertigo to the patient with home care recommendations and careful return precautions.        Final Clinical Impression(s) / ED Diagnoses Final diagnoses:  Vertigo  Acute nonintractable headache, unspecified headache type  Seizure disorder [facility])    Rx / DC Orders ED Discharge Orders          Ordered    meclizine (ANTIVERT) 25 MG tablet  3 times daily PRN        02/15/24 2015              Wynetta Heckle, MD 02/15/24 2027

## 2024-02-15 NOTE — ED Notes (Signed)
 Patient given juice for blood sugar 61.

## 2024-02-16 LAB — LEVETIRACETAM LEVEL: Levetiracetam Lvl: 16.3 ug/mL (ref 10.0–40.0)

## 2024-02-28 ENCOUNTER — Ambulatory Visit (INDEPENDENT_AMBULATORY_CARE_PROVIDER_SITE_OTHER): Payer: BC Managed Care – PPO | Admitting: Neurology

## 2024-02-28 ENCOUNTER — Ambulatory Visit (INDEPENDENT_AMBULATORY_CARE_PROVIDER_SITE_OTHER): Payer: Self-pay | Admitting: Neurology

## 2024-02-28 DIAGNOSIS — G40909 Epilepsy, unspecified, not intractable, without status epilepticus: Secondary | ICD-10-CM

## 2024-02-28 NOTE — Progress Notes (Unsigned)
 Routine EEG completed, results pending Neurology review and interpretation

## 2024-02-29 NOTE — Procedures (Signed)
 Patient:  Veronica Trevino   Sex: female  DOB:  05-Oct-2005  Date of study:   02/28/2024               Clinical history: This is a 19 year old female with diagnosis of generalized seizure disorder since 2023 with initial EEG showed brief generalized discharges.  This is a follow-up EEG for evaluation of epileptiform discharges.  This is a follow-up EEG for evaluation of epileptiform discharges.  Medication:   Keppra             Procedure: The tracing was carried out on a 32 channel digital Cadwell recorder reformatted into 16 channel montages with 1 devoted to EKG.  The 10 /20 international system electrode placement was used. Recording was done during awake state. Recording time 34 minutes.   Description of findings: Background rhythm consists of amplitude of 35 microvolt and frequency of 9-10 hertz posterior dominant rhythm. There was normal anterior posterior gradient noted. Background was well organized, continuous and symmetric with no focal slowing. There was muscle artifact noted. Hyperventilation resulted in slowing of the background activity. Photic stimulation using stepwise increase in photic frequency resulted in bilateral symmetric driving response. Throughout the recording there were no focal or generalized epileptiform activities in the form of spikes or sharps noted. There were no transient rhythmic activities or electrographic seizures noted. One lead EKG rhythm strip revealed sinus rhythm at a rate of   70 bpm.  Impression: This EEG is normal during awake state. Please note that normal EEG does not exclude epilepsy, clinical correlation is indicated.  \ Ventura Gins, MD

## 2024-03-03 ENCOUNTER — Encounter (INDEPENDENT_AMBULATORY_CARE_PROVIDER_SITE_OTHER): Payer: Self-pay | Admitting: Neurology

## 2024-03-03 ENCOUNTER — Ambulatory Visit (INDEPENDENT_AMBULATORY_CARE_PROVIDER_SITE_OTHER): Admitting: Neurology

## 2024-03-03 VITALS — BP 114/76 | HR 72 | Ht 63.58 in | Wt 104.3 lb

## 2024-03-03 DIAGNOSIS — G40909 Epilepsy, unspecified, not intractable, without status epilepticus: Secondary | ICD-10-CM | POA: Diagnosis not present

## 2024-03-03 DIAGNOSIS — R55 Syncope and collapse: Secondary | ICD-10-CM

## 2024-03-03 MED ORDER — LEVETIRACETAM 500 MG PO TABS: ORAL_TABLET | ORAL | 1 refills | Status: DC

## 2024-03-03 NOTE — Progress Notes (Signed)
 Patient: Veronica Trevino MRN: [id number] Sex: female DOB: Sep 22, 2006  Provider: Ventura Gins, MD Location of Care: [facility]  Note type: Routine return visit  Referral Source: pcp History from: patient and [facility] chart Chief Complaint: seizures  History of Present Illness: Veronica Trevino is a 18 y.o. female is here for follow-up management of seizure disorder. 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  She was also having episodes of vasovagal event. She has been on Keppra  with moderate dose and with no clinical seizure activity since starting medication. She was last seen in November 2024 and just recently had an EEG a few days ago with normal result.  The EEG prior to that was in March 2024 which was slightly abnormal with 1 brief generalized sharply contoured waves. She has been doing very well without having any clinical seizure activity for around 2 years and she has been taking her medication regularly with the current dose of 500 mg in a.m. and 1000 mg in p.m.  She has been tolerating medication well with no side effects. Currently she is working at [employer] and recently she was seen in the emergency room due to an episode of dizziness and vertigo. Overall she sleeps well without any difficulty and with no other issues and and she would like to know if she would be able to gradually taper and discontinue medication.  Review of Systems: Review of system as per HPI, otherwise negative.  Past Medical History:  Diagnosis Date   Seizure disorder (HCC) 01/15/2022   1 witnessed generalized tonic-clonic seizure with abnormal EEG.  Now on Keppra .   Hospitalizations: Yes.  , Head Injury: No., Nervous System Infections: No., Immunizations up to date: Yes.    Surgical History Past Surgical History:  Procedure Laterality Date   DENTAL SURGERY      Family History family history includes Healthy  in her father; Peripheral Artery Disease in her mother; Pulmonary Hypertension in her mother; Seizures in her brother; Supraventricular tachycardia in her mother.   Social History Social History   Socioeconomic History   Marital status: Single    Spouse name: Not on file   Number of children: Not on file   Years of education: Not on file   Highest education level: High school graduate  Occupational History   Not on file  Tobacco Use   Smoking status: Never    Passive exposure: Never   Smokeless tobacco: Never  Vaping Use   Vaping status: Never Used  Substance and Sexual Activity   Alcohol use: Never   Drug use: Never   Sexual activity: Never  Other Topics Concern   Not on file  Social History Narrative   Graduated (2023), [employer]   Patient lives with: Mom, Step Father. 2 Brothers and Sister      What are the patient's hobbies or interest? Reading.    No Employment.           Social Drivers of Corporate investment banker Strain: Not on file  Food Insecurity: Low Risk  (04/04/2023)   Received from [facility]   Hunger Vital Sign    Worried About Running Out of Food in the Last Year: Never true    Ran Out of Food in the Last Year: Never true  Transportation Needs: Not on file (04/04/2023)  Physical Activity: Not on file  Stress: Not on file  Social Connections: Not on file  No Known Allergies  Physical Exam BP 114/76   Pulse 72   Ht 5' 3.58" (1.615 m)   Wt 104 lb 4.4 oz (47.3 kg)   BMI 18.13 kg/m  Gen: Awake, alert, not in distress Skin: No rash, No neurocutaneous stigmata. HEENT: Normocephalic, no dysmorphic features, no conjunctival injection, nares patent, mucous membranes moist, oropharynx clear. Neck: Supple, no meningismus. No focal tenderness. Resp: Clear to auscultation bilaterally CV: Regular rate, normal S1/S2, no murmurs, no rubs Abd: BS present, abdomen soft, non-tender, non-distended. No hepatosplenomegaly or mass Ext: Warm  and well-perfused. No deformities, no muscle wasting, ROM full.  Neurological Examination: MS: Awake, alert, interactive. Normal eye contact, answered the questions appropriately, speech was fluent,  Normal comprehension.  Attention and concentration were normal. Cranial Nerves: Pupils were equal and reactive to light ( 5-62mm);  normal fundoscopic exam with sharp discs, visual field full with confrontation test; EOM normal, no nystagmus; no ptsosis, no double vision, intact facial sensation, face symmetric with full strength of facial muscles, hearing intact to finger rub bilaterally, palate elevation is symmetric, tongue protrusion is symmetric with full movement to both sides.  Sternocleidomastoid and trapezius are with normal strength. Tone-Normal Strength-Normal strength in all muscle groups DTRs-  Biceps Triceps Brachioradialis Patellar Ankle  R 2+ 2+ 2+ 2+ 2+  L 2+ 2+ 2+ 2+ 2+   Plantar responses flexor bilaterally, no clonus noted Sensation: Intact to light touch, temperature, vibration, Romberg negative. Coordination: No dysmetria on FTN test. No difficulty with balance. Gait: Normal walk and run. Tandem gait was normal. Was able to perform toe walking and heel walking without difficulty.   Assessment and Plan 1. Seizure disorder [facility])   2. 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  She has no focal findings on her neurological examination. She had a normal EEG recently and since she has not had any seizure for more than 2 years, I would recommend to gradually taper and discontinue medication over the next 2 months and then I would immediately schedule for another EEG with a sleep deprivation off of medication and if there is  any abnormality then we may restart medication again but if she is doing well then we we will continue without medication. She needs to continue with adequate sleep and limited screen time She does have nasal spray as a rescue medication in case of prolonged seizure activity I will schedule for EEG to be done at 2 months when she would be off of medication I would like to see her after the EEG to discuss the result and if there is any intervention needed based on the results of the EEG.  Patient understood and agreed with the plan.  Meds ordered this encounter  Medications   levETIRAcetam  (KEPPRA ) 500 MG tablet    Sig: 1 Tablet in AM and 2 Tablets in Evening.    Dispense:  90 tablet    Refill:  1   Orders Placed This Encounter  Procedures   Child sleep deprived EEG    Standing Status:   Future    Expiration Date:   03/03/2025    Scheduling Instructions:     To be done at the same time the next appointment in 2 months  Where should this test be performed?:   [facility]

## 2024-03-03 NOTE — Patient Instructions (Addendum)
 Your recent EEG is normal Since you have not had any seizure for a few years and EEG is normal, we will gradually taper and discontinue medication as follows: Decrease Keppra  to 500 mg twice daily for 1 month Then Keppra  500 mg every night for 1 month Then stop the medication and at the same time we will schedule for EEG Have nasal spray available in case of any prolonged seizure activity Be careful on driving for the first 3 months after stopping the medication Return after the EEG to discuss the result

## 2024-03-04 ENCOUNTER — Telehealth (INDEPENDENT_AMBULATORY_CARE_PROVIDER_SITE_OTHER): Payer: Self-pay | Admitting: Neurology

## 2024-03-04 NOTE — Telephone Encounter (Signed)
 Ppwk dropped off & placed in Nab's box

## 2024-03-07 NOTE — Telephone Encounter (Signed)
 Lvm stating paperwork was ready

## 2024-03-19 ENCOUNTER — Telehealth (INDEPENDENT_AMBULATORY_CARE_PROVIDER_SITE_OTHER): Payer: Self-pay | Admitting: Neurology

## 2024-03-19 NOTE — Telephone Encounter (Signed)
 Veronica Trevino called in and said she takes a seizure pill at 8am and 8pm. She thought she missed her 8pm pill last night so she took it around 2/3am. She wanted to know if she should still take her 8am dose this morning? Please reach out to [facility] asap.

## 2024-03-24 ENCOUNTER — Other Ambulatory Visit (INDEPENDENT_AMBULATORY_CARE_PROVIDER_SITE_OTHER): Payer: Self-pay | Admitting: Neurology

## 2024-04-26 ENCOUNTER — Ambulatory Visit
Admission: EM | Admit: 2024-04-26 | Discharge: 2024-04-26 | Disposition: A | Discharge status: Home / Self Care | Attending: Emergency Medicine | Admitting: Emergency Medicine

## 2024-04-26 DIAGNOSIS — B349 Viral infection, unspecified: Secondary | ICD-10-CM

## 2024-04-26 LAB — POC SOFIA SARS ANTIGEN FIA: SARS Coronavirus 2 Ag: NEGATIVE

## 2024-04-26 LAB — POCT RAPID STREP A (OFFICE): Rapid Strep A Screen: NEGATIVE

## 2024-04-26 MED ORDER — LIDOCAINE VISCOUS HCL 2 % MT SOLN: 15.0000 mL | OROMUCOSAL | 0 refills | Status: AC | PRN

## 2024-04-26 MED ORDER — IPRATROPIUM BROMIDE 0.06 % NA SOLN
2.0000 | Freq: Two times a day (BID) | NASAL | 0 refills | Status: AC | PRN
Start: 2024-04-26 — End: ?

## 2024-04-26 NOTE — ED Triage Notes (Signed)
 Patient states that sx started 3 days ago  Sore throat Headache Cough Eyes watering

## 2024-04-26 NOTE — Discharge Instructions (Signed)
 Your symptoms today are most likely being caused by a virus and should steadily improve in time it can take up to 7 to 10 days before you truly start to see a turnaround however things will get better  COVID and strep test negative  May gargle and spit lidocaine  solution every 4 hours as needed to provide temporary relief to your throat  May use nasal spray twice daily as needed to help clear out congestion    You can take Tylenol  and/or Ibuprofen  as needed for fever reduction and pain relief.   For cough: honey 1/2 to 1 teaspoon (you can dilute the honey in water or another fluid).  You can also use guaifenesin and dextromethorphan for cough. You can use a humidifier for chest congestion and cough.  If you don't have a humidifier, you can sit in the bathroom with the hot shower running.      For sore throat: try warm salt water gargles, cepacol lozenges, throat spray, warm tea or water with lemon/honey, popsicles or ice, or OTC cold relief medicine for throat discomfort.   For congestion: take a daily anti-histamine like Zyrtec, Claritin, and a oral decongestant, such as pseudoephedrine.  You can also use Flonase  1-2 sprays in each nostril daily.   It is important to stay hydrated: drink plenty of fluids (water, gatorade/powerade/pedialyte, juices, or teas) to keep your throat moisturized and help further relieve irritation/discomfort.

## 2024-04-26 NOTE — ED Provider Notes (Signed)
 Veronica Trevino    CSN: [id number] Arrival date & time: 04/26/24  0827      History   Chief Complaint Chief Complaint  Patient presents with   Headache   Sore Throat    HPI Veronica Trevino is a 18 y.o. female.   Patient presents for evaluation of intermittent headaches, nasal congestion, nonproductive cough, right sided ear pain/fullness, bilateral watery eyes and a sore throat beginning 3 days ago.  No known sick contacts.  Tolerable to food and liquids.  Has taken Tylenol .  Denies fever, shortness of breath or wheezing.  Past Medical History:  Diagnosis Date   Seizure disorder (HCC) 01/15/2022   1 witnessed generalized tonic-clonic seizure with abnormal EEG.  Now on Keppra .    Patient Active Problem List   Diagnosis Date Noted   Rapid palpitations 04/25/2022    Past Surgical History:  Procedure Laterality Date   DENTAL SURGERY      OB History   No obstetric history on file.      Home Medications    Prior to Admission medications   Medication Sig Start Date End Date Taking? Authorizing Provider  LARIN 24 FE 1-20 MG-MCG(24) tablet Take 1 tablet by mouth daily. 07/14/22  Yes [provider]  levETIRAcetam  (KEPPRA ) 500 MG tablet TAKE 1 TABLET IN THE MORNING AND 2 TABLETS IN THE EVENING 03/24/24  Yes Corinthia Blossom, MD  lidocaine  (XYLOCAINE ) 2 % solution Use as directed 15 mLs in the mouth or throat every 4 (four) hours as needed. 04/26/24  Yes Analeise Mccleery R, NP  ipratropium (ATROVENT ) 0.06 % nasal spray Place 2 sprays into both nostrils 2 (two) times daily as needed for rhinitis. 04/26/24   [name], Shelba SAUNDERS, NP  meclizine  (ANTIVERT ) 25 MG tablet Take 1 tablet (25 mg total) by mouth 3 (three) times daily as needed for dizziness. 02/15/24   Armenta Canning, MD  NAYZILAM  5 MG/0.1ML SOLN Apply 5 mg nasally for seizures lasting longer than 5 minutes. 08/27/23   Corinthia Blossom, MD  omeprazole (PRILOSEC) 20 MG capsule Take 20 mg by mouth daily. 07/23/23    [provider]  ondansetron  (ZOFRAN -ODT) 4 MG disintegrating tablet Take 1 tablet (4 mg total) by mouth every 4 (four) hours as needed for nausea or vomiting. 02/15/24   Armenta Canning, MD    Family History Family History  Problem Relation Age of Onset   Peripheral Artery Disease Mother    Supraventricular tachycardia Mother        With syncope   Pulmonary Hypertension Mother    Healthy Father    Seizures Brother     Social History Social History   Tobacco Use   Smoking status: Never    Passive exposure: Never   Smokeless tobacco: Never  Vaping Use   Vaping status: Never Used  Substance Use Topics   Alcohol use: Never   Drug use: Never     Allergies   Patient has no known allergies.   Review of Systems Review of Systems   Physical Exam Triage Vital Signs ED Triage Vitals  Encounter Vitals Group     BP 04/26/24 0843 113/65     Girls Systolic BP Percentile --      Girls Diastolic BP Percentile --      Boys Systolic BP Percentile --      Boys Diastolic BP Percentile --      Pulse Rate 04/26/24 0843 73     Resp 04/26/24 0843 17  Temp 04/26/24 0843 98.6 F (37 C)     Temp Source 04/26/24 0843 Oral     SpO2 04/26/24 0843 95 %     Weight --      Height --      Head Circumference --      Peak Flow --      Pain Score 04/26/24 0846 0     Pain Loc --      Pain Education --      Exclude from Growth Chart --    No data found.  Updated Vital Signs BP 113/65 (BP Location: Right Arm)   Pulse 73   Temp 98.6 F (37 C) (Oral)   Resp 17   SpO2 95%   Visual Acuity Right Eye Distance:   Left Eye Distance:   Bilateral Distance:    Right Eye Near:   Left Eye Near:    Bilateral Near:     Physical Exam Constitutional:      Appearance: Normal appearance.  HENT:     Head: Normocephalic.     Right Ear: Tympanic membrane, ear canal and external ear normal.     Left Ear: Tympanic membrane, ear canal and external ear normal.     Nose: Congestion  present.     Mouth/Throat:     Mouth: Mucous membranes are moist.     Pharynx: Oropharynx is clear. Posterior oropharyngeal erythema present.  Eyes:     Extraocular Movements: Extraocular movements intact.  Cardiovascular:     Rate and Rhythm: Regular rhythm.     Pulses: Normal pulses.     Heart sounds: Normal heart sounds.  Pulmonary:     Effort: Pulmonary effort is normal.     Breath sounds: Normal breath sounds.  Musculoskeletal:     Cervical back: Normal range of motion.  Lymphadenopathy:     Cervical: Cervical adenopathy present.  Neurological:     Mental Status: She is alert and oriented to person, place, and time. Mental status is at baseline.      UC Treatments / Results  Labs (all labs ordered are listed, but only abnormal results are displayed) Labs Reviewed  POCT RAPID STREP A (OFFICE) - Normal  POC SOFIA SARS ANTIGEN FIA - Normal    EKG   Radiology No results found.  Procedures Procedures (including critical care time)  Medications Ordered in UC Medications - No data to display  Initial Impression / Assessment and Plan / UC Course  I have reviewed the triage vital signs and the nursing notes.  Pertinent labs & imaging results that were available during my care of the patient were reviewed by me and considered in my medical decision making (see chart for details).  Viral illness  Patient is in no signs of distress nor toxic appearing.  Vital signs are stable.  Low suspicion for pneumonia, pneumothorax or bronchitis and therefore will defer imaging.  COVID and strep testing negative.  Prescribed viscous lidocaine  and Atrovent  nasal spray sore throat and congestion are most worrisome symptoms.May use additional over-the-counter medications as needed for supportive care.  May follow-up with urgent care as needed if symptoms persist or worsen.  Note given.   Final Clinical Impressions(s) / UC Diagnoses   Final diagnoses:  Viral illness     Discharge  Instructions      Your symptoms today are most likely being caused by a virus and should steadily improve in time it can take up to 7 to 10 days before you truly  start to see a turnaround however things will get better  COVID and strep test negative  May gargle and spit lidocaine  solution every 4 hours as needed to provide temporary relief to your throat  May use nasal spray twice daily as needed to help clear out congestion    You can take Tylenol  and/or Ibuprofen  as needed for fever reduction and pain relief.   For cough: honey 1/2 to 1 teaspoon (you can dilute the honey in water or another fluid).  You can also use guaifenesin and dextromethorphan for cough. You can use a humidifier for chest congestion and cough.  If you don't have a humidifier, you can sit in the bathroom with the hot shower running.      For sore throat: try warm salt water gargles, cepacol lozenges, throat spray, warm tea or water with lemon/honey, popsicles or ice, or OTC cold relief medicine for throat discomfort.   For congestion: take a daily anti-histamine like Zyrtec, Claritin, and a oral decongestant, such as pseudoephedrine.  You can also use Flonase  1-2 sprays in each nostril daily.   It is important to stay hydrated: drink plenty of fluids (water, gatorade/powerade/pedialyte, juices, or teas) to keep your throat moisturized and help further relieve irritation/discomfort.    ED Prescriptions     Medication Sig Dispense Auth. Provider   ipratropium (ATROVENT ) 0.06 % nasal spray Place 2 sprays into both nostrils 2 (two) times daily as needed for rhinitis. 15 mL Tesneem Dufrane R, NP   lidocaine  (XYLOCAINE ) 2 % solution Use as directed 15 mLs in the mouth or throat every 4 (four) hours as needed. 100 mL Teresa Shelba SAUNDERS, NP      PDMP not reviewed this encounter.   Teresa Shelba SAUNDERS, [place] 04/26/24 [phone]

## 2024-04-28 ENCOUNTER — Encounter: Payer: Self-pay | Admitting: Emergency Medicine

## 2024-04-28 ENCOUNTER — Ambulatory Visit
Admission: EM | Admit: 2024-04-28 | Discharge: 2024-04-28 | Disposition: A | Discharge status: Home / Self Care | Attending: Emergency Medicine | Admitting: Emergency Medicine

## 2024-04-28 DIAGNOSIS — J0100 Acute maxillary sinusitis, unspecified: Secondary | ICD-10-CM | POA: Diagnosis not present

## 2024-04-28 MED ORDER — AMOXICILLIN-POT CLAVULANATE 875-125 MG PO TABS: 1.0000 | ORAL_TABLET | Freq: Two times a day (BID) | ORAL | 0 refills | Status: DC

## 2024-04-28 NOTE — ED Provider Notes (Signed)
 Veronica Trevino    CSN: [id number] Arrival date & time: 04/28/24  1456      History   Chief Complaint No chief complaint on file.   HPI Veronica Trevino is a 18 y.o. female.  Patient presents with 1 week history of sinus congestion, sinus pressure, runny nose, postnasal drip, ear pain, sore throat, cough.  No fever or shortness of breath.  Patient was seen at this urgent care on 04/26/2024; diagnosed with viral illness; negative strep and COVID; treated with viscous lidocaine  and ipratropium nasal spray.  She has been using these medications without relief.  The history is provided by the patient and medical records.    Past Medical History:  Diagnosis Date   Seizure disorder (HCC) 01/15/2022   1 witnessed generalized tonic-clonic seizure with abnormal EEG.  Now on Keppra .    Patient Active Problem List   Diagnosis Date Noted   Rapid palpitations 04/25/2022    Past Surgical History:  Procedure Laterality Date   DENTAL SURGERY      OB History   No obstetric history on file.      Home Medications    Prior to Admission medications   Medication Sig Start Date End Date Taking? Authorizing Provider  amoxicillin -clavulanate (AUGMENTIN ) 875-125 MG tablet Take 1 tablet by mouth every 12 (twelve) hours. 04/28/24   Corlis Burnard DEL, NP  ipratropium (ATROVENT ) 0.06 % nasal spray Place 2 sprays into both nostrils 2 (two) times daily as needed for rhinitis. 04/26/24   White, Shelba SAUNDERS, NP  LARIN 24 FE 1-20 MG-MCG(24) tablet Take 1 tablet by mouth daily. 07/14/22   [provider]  levETIRAcetam  (KEPPRA ) 500 MG tablet TAKE 1 TABLET IN THE MORNING AND 2 TABLETS IN THE EVENING 03/24/24   Corinthia Blossom, MD  lidocaine  (XYLOCAINE ) 2 % solution Use as directed 15 mLs in the mouth or throat every 4 (four) hours as needed. 04/26/24   Teresa Shelba SAUNDERS, NP  meclizine  (ANTIVERT ) 25 MG tablet Take 1 tablet (25 mg total) by mouth 3 (three) times daily as needed for dizziness. 02/15/24    Armenta Canning, MD  NAYZILAM  5 MG/0.1ML SOLN Apply 5 mg nasally for seizures lasting longer than 5 minutes. 08/27/23   Corinthia Blossom, MD  omeprazole (PRILOSEC) 20 MG capsule Take 20 mg by mouth daily. 07/23/23   [provider]  ondansetron  (ZOFRAN -ODT) 4 MG disintegrating tablet Take 1 tablet (4 mg total) by mouth every 4 (four) hours as needed for nausea or vomiting. 02/15/24   Armenta Canning, MD    Family History Family History  Problem Relation Age of Onset   Peripheral Artery Disease Mother    Supraventricular tachycardia Mother        With syncope   Pulmonary Hypertension Mother    Healthy Father    Seizures Brother     Social History Social History   Tobacco Use   Smoking status: Never    Passive exposure: Never   Smokeless tobacco: Never  Vaping Use   Vaping status: Never Used  Substance Use Topics   Alcohol use: Never   Drug use: Never     Allergies   Patient has no known allergies.   Review of Systems Review of Systems  Constitutional:  Negative for chills and fever.  HENT:  Positive for congestion, ear pain, postnasal drip, rhinorrhea and sore throat.   Respiratory:  Positive for cough. Negative for shortness of breath.      Physical Exam Triage Vital Signs ED  Triage Vitals  Encounter Vitals Group     BP      Girls Systolic BP Percentile      Girls Diastolic BP Percentile      Boys Systolic BP Percentile      Boys Diastolic BP Percentile      Pulse      Resp      Temp      Temp src      SpO2      Weight      Height      Head Circumference      Peak Flow      Pain Score      Pain Loc      Pain Education      Exclude from Growth Chart    No data found.  Updated Vital Signs BP 113/73   Pulse 73   Temp 98.9 F (37.2 C)   Resp 18   SpO2 95%   Visual Acuity Right Eye Distance:   Left Eye Distance:   Bilateral Distance:    Right Eye Near:   Left Eye Near:    Bilateral Near:     Physical Exam Constitutional:       General: She is not in acute distress. HENT:     Right Ear: Tympanic membrane normal.     Left Ear: Tympanic membrane normal.     Nose: Congestion and rhinorrhea present.     Mouth/Throat:     Mouth: Mucous membranes are moist.     Pharynx: Oropharynx is clear.  Cardiovascular:     Rate and Rhythm: Normal rate and regular rhythm.     Heart sounds: Normal heart sounds.  Pulmonary:     Effort: Pulmonary effort is normal. No respiratory distress.     Breath sounds: Normal breath sounds.  Neurological:     Mental Status: She is alert.      UC Treatments / Results  Labs (all labs ordered are listed, but only abnormal results are displayed) Labs Reviewed - No data to display  EKG   Radiology No results found.  Procedures Procedures (including critical care time)  Medications Ordered in UC Medications - No data to display  Initial Impression / Assessment and Plan / UC Course  I have reviewed the triage vital signs and the nursing notes.  Pertinent labs & imaging results that were available during my care of the patient were reviewed by me and considered in my medical decision making (see chart for details).    Acute sinusitis.  Patient is not improving with symptomatic treatment only.  Treating today with Augmentin .  Education provided on sinusitis.  Instructed patient to follow up with her PCP if her symptoms are not improving.  She agrees to plan of care.   Final Clinical Impressions(s) / UC Diagnoses   Final diagnoses:  Acute non-recurrent maxillary sinusitis     Discharge Instructions      Take the Augmentin  as directed.  Follow-up with your primary care provider if your symptoms are not improving.      ED Prescriptions     Medication Sig Dispense Auth. Provider   amoxicillin -clavulanate (AUGMENTIN ) 875-125 MG tablet  (Status: Discontinued) Take 1 tablet by mouth every 12 (twelve) hours. 14 tablet Corlis Burnard DEL, NP   amoxicillin -clavulanate (AUGMENTIN )  875-125 MG tablet Take 1 tablet by mouth every 12 (twelve) hours. 14 tablet Corlis Burnard DEL, NP      PDMP not reviewed this encounter.   Corlis,  Burnard DEL, NP 04/28/24 1534

## 2024-04-28 NOTE — Discharge Instructions (Addendum)
 Take the Augmentin as directed.  Follow up with your primary care provider if your symptoms are not improving.

## 2024-05-05 ENCOUNTER — Ambulatory Visit (INDEPENDENT_AMBULATORY_CARE_PROVIDER_SITE_OTHER): Admitting: Neurology

## 2024-05-05 ENCOUNTER — Encounter (INDEPENDENT_AMBULATORY_CARE_PROVIDER_SITE_OTHER): Payer: Self-pay | Admitting: Neurology

## 2024-05-05 VITALS — BP 120/76 | HR 88 | Ht 63.78 in | Wt 108.8 lb

## 2024-05-05 DIAGNOSIS — G40909 Epilepsy, unspecified, not intractable, without status epilepticus: Secondary | ICD-10-CM

## 2024-05-05 DIAGNOSIS — R55 Syncope and collapse: Secondary | ICD-10-CM | POA: Diagnosis not present

## 2024-05-05 NOTE — Progress Notes (Signed)
 EEG complete - results pending

## 2024-05-05 NOTE — Procedures (Signed)
 Patient:  Veronica Trevino   Sex: female  DOB:  08/10/2006  Date of study:    05/05/2024              Clinical history: This is a 18 year old female with diagnosis of seizure disorder since April 2023 with no seizure activity since then and with the last EEG was normal.  Patient tapered and discontinued Keppra .  This is a follow-up EEG for evaluation of epileptiform discharges off of medication.  Medication: None           Procedure: The tracing was carried out on a 32 channel digital Cadwell recorder reformatted into 16 channel montages with 1 devoted to EKG.  The 10 /20 international system electrode placement was used. Recording was done during awake, drowsiness and sleep states. Recording time 41 minutes.   Description of findings: Background rhythm consists of amplitude of     40 microvolt and frequency of 9-10 hertz posterior dominant rhythm. There was normal anterior posterior gradient noted. Background was well organized, continuous and symmetric with no focal slowing. There was muscle artifact noted. During drowsiness and sleep there was gradual decrease in background frequency noted. During the early stages of sleep there were symmetrical sleep spindles and vertex sharp waves noted.  Hyperventilation resulted in slowing of the background activity. Photic stimulation using stepwise increase in photic frequency resulted in bilateral symmetric driving response. Throughout the recording there were no focal or generalized epileptiform activities in the form of spikes or sharps noted. There were no transient rhythmic activities or electrographic seizures noted. One lead EKG rhythm strip revealed sinus rhythm at a rate of 55 bpm.  Impression: This EEG is normal during awake and asleep states. Please note that normal EEG does not exclude epilepsy, clinical correlation is indicated.     Norwood Abu, MD

## 2024-05-05 NOTE — Progress Notes (Signed)
 Patient: Veronica Trevino MRN: [id number] Sex: female DOB: [birth year]  Provider: Norwood Abu, MD Location of Care: [facility]  Note type: Routine return visit  Referral Source: [facility], [facility]  History from: patient and [facility] chart Chief Complaint: follow up for Seizure disorder   History of Present Illness: Veronica Trevino is a 18 y.o. female is here for follow-up management of seizure disorder and discussing the EEG result. 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 She was last seen in June 2025 and has been doing well without having any seizure activity so on her last visit since she had normal EEG and no seizure for about 2 years, she was recommended to gradually taper and discontinue Keppra  and then perform another EEG off of medication and see how she does. She just discontinued the medication few days ago and since then she has been doing well and her EEG today prior to this visit did not show any epileptiform discharges or seizure activity. She usually sleeps well without any difficulty and with no awakening.  She has no behavioral or mood changes.  She has not had any significant headache or fainting episodes.  Overall she thinks that she is doing well without any specific complaints.  She does have nasal spray as a rescue medication in case of prolonged seizure activity.  Review of Systems: Review of system as per HPI, otherwise negative.  Past Medical History:  Diagnosis Date   Seizure disorder (HCC) 01/15/2022   1 witnessed generalized tonic-clonic seizure with abnormal EEG.  Now on Keppra .   Hospitalizations: No., Head Injury: No., Nervous System Infections: No., Immunizations up to date: Yes.     Surgical History Past Surgical History:  Procedure  Laterality Date   DENTAL SURGERY      Family History family history includes Healthy in her father; Peripheral Artery Disease in her mother; Pulmonary Hypertension in her mother; Seizures in her brother; Supraventricular tachycardia in her mother.   Social History Social History   Socioeconomic History   Marital status: Single    Spouse name: Not on file   Number of children: Not on file   Years of education: Not on file   Highest education level: High school graduate  Occupational History   Not on file  Tobacco Use   Smoking status: Never    Passive exposure: Never   Smokeless tobacco: Never  Vaping Use   Vaping status: Never Used  Substance and Sexual Activity   Alcohol use: Never   Drug use: Never   Sexual activity: Never  Other Topics Concern   Not on file  Social History Narrative   Graduated (2023), [employer]   Patient lives with: Mom, Step Father. 3 Brothers and Sister   1 dog      What are the patient's hobbies or interest? Reading.    No Employment.           Social Drivers of Corporate investment banker Strain: Not on file  Food Insecurity: Low Risk  (04/04/2023)   Received from [facility]   Hunger Vital Sign    Within the past 12 months, you worried that your food would run out before you got money to buy more: Never true    Within the past 12 months, the food you bought just didn't  last and you didn't have money to get more. : Never true  Transportation Needs: Not on file (04/04/2023)  Physical Activity: Not on file  Stress: Not on file  Social Connections: Not on file     No Known Allergies  Physical Exam BP 120/76   Pulse 88   Ht 5' 3.78 (1.62 m)   Wt 108 lb 12.8 oz (49.4 kg)   BMI 18.80 kg/m  Gen: Awake, alert, not in distress Skin: No rash, No neurocutaneous stigmata. HEENT: Normocephalic, no dysmorphic features, no conjunctival injection, nares patent, mucous membranes moist, oropharynx clear. Neck: Supple, no  meningismus. No focal tenderness. Resp: Clear to auscultation bilaterally CV: Regular rate, normal S1/S2, no murmurs, no rubs Abd: BS present, abdomen soft, non-tender, non-distended. No hepatosplenomegaly or mass Ext: Warm and well-perfused. No deformities, no muscle wasting, ROM full.  Neurological Examination: MS: Awake, alert, interactive. Normal eye contact, answered the questions appropriately, speech was fluent,  Normal comprehension.  Attention and concentration were normal. Cranial Nerves: Pupils were equal and reactive to light ( 5-67mm);  normal fundoscopic exam with sharp discs, visual field full with confrontation test; EOM normal, no nystagmus; no ptsosis, no double vision, intact facial sensation, face symmetric with full strength of facial muscles, hearing intact to finger rub bilaterally, palate elevation is symmetric, tongue protrusion is symmetric with full movement to both sides.  Sternocleidomastoid and trapezius are with normal strength. Tone-Normal Strength-Normal strength in all muscle groups DTRs-  Biceps Triceps Brachioradialis Patellar Ankle  R 2+ 2+ 2+ 2+ 2+  L 2+ 2+ 2+ 2+ 2+   Plantar responses flexor bilaterally, no clonus noted Sensation: Intact to light touch, temperature, vibration, Romberg negative. Coordination: No dysmetria on FTN test. No difficulty with balance. Gait: Normal walk and run. Tandem gait was normal. Was able to perform toe walking and heel walking without difficulty.   Assessment and Plan 1. Seizure disorder (HCC)   2. Vasovagal episode    This is a 18 year old female with history of seizure disorder, was on Keppra  with no clinical seizure for 2 years and then on her last visit last month gradually tapered and discontinued the Keppra  and her EEG off of medication today did not show any epileptiform discharges.  She has no focal findings on her neurological examination and has not had any other issues such as headache or fainting episodes  recently. Since she is doing well without having any seizure and currently on no medications with normal EEG, I do not think she needs further neurological testing or follow-up visit at this time. She will continue with adequate sleep and limited screen time and more hydration to prevent from any seizure or any vasovagal events. She does have nasal spray as a rescue medication in case of prolonged seizure activity and I told her that over the next couple of months she needs to be more careful in terms of driving and make sure that she would have her nasal spray with herself in case of any prolonged seizure activity Otherwise she will continue follow-up with her pediatrician and I will be available for any question or concerns or if there is any recurrence of seizure.  Patient understood and agreed with the plan.  No orders of the defined types were placed in this encounter.  No orders of the defined types were placed in this encounter.

## 2024-05-26 ENCOUNTER — Ambulatory Visit
Admission: EM | Admit: 2024-05-26 | Discharge: 2024-05-26 | Disposition: A | Discharge status: Home / Self Care | Attending: Emergency Medicine | Admitting: Emergency Medicine

## 2024-05-26 DIAGNOSIS — R051 Acute cough: Secondary | ICD-10-CM | POA: Diagnosis not present

## 2024-05-26 DIAGNOSIS — J029 Acute pharyngitis, unspecified: Secondary | ICD-10-CM | POA: Diagnosis not present

## 2024-05-26 DIAGNOSIS — J069 Acute upper respiratory infection, unspecified: Secondary | ICD-10-CM

## 2024-05-26 LAB — POC SOFIA SARS ANTIGEN FIA: SARS Coronavirus 2 Ag: NEGATIVE

## 2024-05-26 LAB — POCT RAPID STREP A (OFFICE): Rapid Strep A Screen: NEGATIVE

## 2024-05-26 NOTE — ED Provider Notes (Signed)
 Veronica Trevino    CSN: [id number] Arrival date & time: 05/26/24  1013      History   Chief Complaint Chief Complaint  Patient presents with   Headache    Headache, nasal congestion, coughing and sore throat. X2-3 days    HPI Veronica Trevino is a 18 y.o. female.  Patient presents with 2 to 3-day history of runny nose, congestion, sore throat, cough, headache.  No OTC medications taken today.  No fever, shortness of breath, vomiting, diarrhea.  Patient was seen at this urgent care on 04/26/2024; diagnosed with viral illness; negative strep and COVID; treated with viscous lidocaine  and ipratropium nasal spray.  Patient was seen here on 04/28/2024; diagnosed with acute sinusitis; treated with Augmentin .  The history is provided by the patient and medical records.    Past Medical History:  Diagnosis Date   Seizure disorder (HCC) 01/15/2022   1 witnessed generalized tonic-clonic seizure with abnormal EEG.  Now on Keppra .    Patient Active Problem List   Diagnosis Date Noted   Rapid palpitations 04/25/2022    Past Surgical History:  Procedure Laterality Date   DENTAL SURGERY      OB History   No obstetric history on file.      Home Medications    Prior to Admission medications   Medication Sig Start Date End Date Taking? Authorizing Provider  LARIN 24 FE 1-20 MG-MCG(24) tablet Take 1 tablet by mouth daily. 07/14/22  Yes [provider]  amoxicillin -clavulanate (AUGMENTIN ) 875-125 MG tablet Take 1 tablet by mouth every 12 (twelve) hours. Patient not taking: No sig reported 04/28/24   Corlis Burnard DEL, NP  ipratropium (ATROVENT ) 0.06 % nasal spray Place 2 sprays into both nostrils 2 (two) times daily as needed for rhinitis. Patient not taking: No sig reported 04/26/24   Teresa Shelba SAUNDERS, NP  levETIRAcetam  (KEPPRA ) 500 MG tablet TAKE 1 TABLET IN THE MORNING AND 2 TABLETS IN THE EVENING 03/24/24   Corinthia Blossom, MD  lidocaine  (XYLOCAINE ) 2 % solution Use as directed 15  mLs in the mouth or throat every 4 (four) hours as needed. Patient not taking: No sig reported 04/26/24   Teresa Shelba SAUNDERS, NP  meclizine  (ANTIVERT ) 25 MG tablet Take 1 tablet (25 mg total) by mouth 3 (three) times daily as needed for dizziness. 02/15/24   Armenta Canning, MD  NAYZILAM  5 MG/0.1ML SOLN Apply 5 mg nasally for seizures lasting longer than 5 minutes. Patient not taking: No sig reported 08/27/23   Corinthia Blossom, MD  omeprazole (PRILOSEC) 20 MG capsule Take 20 mg by mouth daily. Patient not taking: No sig reported 07/23/23   [provider]  ondansetron  (ZOFRAN -ODT) 4 MG disintegrating tablet Take 1 tablet (4 mg total) by mouth every 4 (four) hours as needed for nausea or vomiting. 02/15/24   Armenta Canning, MD    Family History Family History  Problem Relation Age of Onset   Peripheral Artery Disease Mother    Supraventricular tachycardia Mother        With syncope   Pulmonary Hypertension Mother    Healthy Father    Seizures Brother     Social History Social History   Tobacco Use   Smoking status: Never    Passive exposure: Never   Smokeless tobacco: Never  Vaping Use   Vaping status: Never Used  Substance Use Topics   Alcohol use: Never   Drug use: Never     Allergies   Patient has no known  allergies.   Review of Systems Review of Systems  Constitutional:  Negative for chills and fever.  HENT:  Positive for congestion, rhinorrhea and sore throat. Negative for ear pain.   Respiratory:  Positive for cough. Negative for shortness of breath.   Gastrointestinal:  Negative for diarrhea and vomiting.  Neurological:  Positive for headaches.     Physical Exam Triage Vital Signs ED Triage Vitals  Encounter Vitals Group     BP 05/26/24 1037 117/76     Girls Systolic BP Percentile --      Girls Diastolic BP Percentile --      Boys Systolic BP Percentile --      Boys Diastolic BP Percentile --      Pulse Rate 05/26/24 1037 99     Resp 05/26/24 1037  19     Temp 05/26/24 1037 98 F (36.7 C)     Temp Source 05/26/24 1037 Temporal     SpO2 05/26/24 1037 96 %     Weight 05/26/24 1035 110 lb (49.9 kg)     Height 05/26/24 1035 5' 3 (1.6 m)     Head Circumference --      Peak Flow --      Pain Score 05/26/24 1035 5     Pain Loc --      Pain Education --      Exclude from Growth Chart --    No data found.  Updated Vital Signs BP 117/76 (BP Location: Left Arm)   Pulse 99   Temp 98 F (36.7 C) (Temporal)   Resp 19   Ht 5' 3 (1.6 m)   Wt 110 lb (49.9 kg)   LMP 05/14/2024   SpO2 96%   BMI 19.49 kg/m   Visual Acuity Right Eye Distance:   Left Eye Distance:   Bilateral Distance:    Right Eye Near:   Left Eye Near:    Bilateral Near:     Physical Exam Constitutional:      General: She is not in acute distress. HENT:     Right Ear: Tympanic membrane normal.     Left Ear: Tympanic membrane normal.     Nose: Rhinorrhea present.     Mouth/Throat:     Mouth: Mucous membranes are moist.     Pharynx: Oropharynx is clear.  Cardiovascular:     Rate and Rhythm: Normal rate and regular rhythm.     Heart sounds: Normal heart sounds.  Pulmonary:     Effort: Pulmonary effort is normal. No respiratory distress.     Breath sounds: Normal breath sounds.  Neurological:     Mental Status: She is alert.      UC Treatments / Results  Labs (all labs ordered are listed, but only abnormal results are displayed) Labs Reviewed  POCT RAPID STREP A (OFFICE) - Normal  POC SOFIA SARS ANTIGEN FIA - Normal    EKG   Radiology No results found.  Procedures Procedures (including critical care time)  Medications Ordered in UC Medications - No data to display  Initial Impression / Assessment and Plan / UC Course  I have reviewed the triage vital signs and the nursing notes.  Pertinent labs & imaging results that were available during my care of the patient were reviewed by me and considered in my medical decision making (see  chart for details).    Viral URI, viral pharyngitis, cough.  Rapid COVID and strep negative.  Discussed symptomatic treatment including Tylenol  or ibuprofen  as  needed for fever or discomfort, plain Mucinex as needed for congestion, rest, hydration.  Instructed patient to follow-up with her PCP if she is not improving.  Education provided on viral URI and sore throat.  Patient agrees to plan of care.   Final Clinical Impressions(s) / UC Diagnoses   Final diagnoses:  Acute cough  Viral pharyngitis  Viral URI     Discharge Instructions      The COVID and strep tests are negative.   Take Tylenol  or ibuprofen  as needed for fever or discomfort.  Take plain Mucinex as needed for congestion.  Rest and keep yourself hydrated.    Follow-up with your primary care provider if your symptoms are not improving.         ED Prescriptions   None    PDMP not reviewed this encounter.   Corlis Burnard DEL, NP 05/26/24 1109

## 2024-05-26 NOTE — ED Triage Notes (Signed)
 Pt states that she has a headache, nasal congestion, coughing, sore throat and watery eyes. X2-3 days

## 2024-05-26 NOTE — Discharge Instructions (Signed)
 The COVID and strep tests are negative.   Take Tylenol  or ibuprofen as needed for fever or discomfort.  Take plain Mucinex as needed for congestion.  Rest and keep yourself hydrated.    Follow-up with your primary care provider if your symptoms are not improving.

## 2024-08-27 ENCOUNTER — Ambulatory Visit
Admission: EM | Admit: 2024-08-27 | Discharge: 2024-08-27 | Disposition: A | Discharge status: Home / Self Care | Payer: Self-pay | Attending: Emergency Medicine | Admitting: Emergency Medicine

## 2024-08-27 DIAGNOSIS — S61012A Laceration without foreign body of left thumb without damage to nail, initial encounter: Secondary | ICD-10-CM

## 2024-08-27 MED ORDER — CEPHALEXIN 500 MG PO CAPS: 500.0000 mg | ORAL_CAPSULE | Freq: Three times a day (TID) | ORAL | 0 refills | Status: AC

## 2024-08-27 NOTE — ED Provider Notes (Signed)
 Veronica Trevino    CSN: [id number] Arrival date & time: 08/27/24  1835      History   Chief Complaint Chief Complaint  Patient presents with   Laceration    HPI Veronica Trevino is a 18 y.o. female.  Patient presents with a laceration on her left thumb that occurred at work this evening when she was using a box cutter and accidentally cut her thumb.  Bleeding controlled with direct pressure.  No numbness or weakness.  Last tetanus 2022.  The history is provided by the patient and medical records.    Past Medical History:  Diagnosis Date   Seizure disorder (HCC) 01/15/2022   1 witnessed generalized tonic-clonic seizure with abnormal EEG.  Now on Keppra .    Patient Active Problem List   Diagnosis Date Noted   Rapid palpitations 04/25/2022    Past Surgical History:  Procedure Laterality Date   DENTAL SURGERY      OB History   No obstetric history on file.      Home Medications    Prior to Admission medications   Medication Sig Start Date End Date Taking? Authorizing Provider  cephALEXin (KEFLEX) 500 MG capsule Take 1 capsule (500 mg total) by mouth 3 (three) times daily for 5 days. 08/27/24 09/01/24 Yes Corlis Burnard DEL, NP  ipratropium (ATROVENT ) 0.06 % nasal spray Place 2 sprays into both nostrils 2 (two) times daily as needed for rhinitis. Patient not taking: No sig reported 04/26/24   Teresa Shelba SAUNDERS, NP  LARIN 24 FE 1-20 MG-MCG(24) tablet Take 1 tablet by mouth daily. 07/14/22   [provider]  levETIRAcetam  (KEPPRA ) 500 MG tablet TAKE 1 TABLET IN THE MORNING AND 2 TABLETS IN THE EVENING Patient not taking: Reported on 08/27/2024 03/24/24   Corinthia Blossom, MD  lidocaine  (XYLOCAINE ) 2 % solution Use as directed 15 mLs in the mouth or throat every 4 (four) hours as needed. Patient not taking: No sig reported 04/26/24   Teresa Shelba SAUNDERS, NP  meclizine  (ANTIVERT ) 25 MG tablet Take 1 tablet (25 mg total) by mouth 3 (three) times daily as needed for  dizziness. 02/15/24   Armenta Canning, MD  NAYZILAM  5 MG/0.1ML SOLN Apply 5 mg nasally for seizures lasting longer than 5 minutes. 08/27/23   Corinthia Blossom, MD  omeprazole (PRILOSEC) 20 MG capsule Take 20 mg by mouth daily. Patient not taking: No sig reported 07/23/23   [provider]  ondansetron  (ZOFRAN -ODT) 4 MG disintegrating tablet Take 1 tablet (4 mg total) by mouth every 4 (four) hours as needed for nausea or vomiting. 02/15/24   Armenta Canning, MD    Family History Family History  Problem Relation Age of Onset   Peripheral Artery Disease Mother    Supraventricular tachycardia Mother        With syncope   Pulmonary Hypertension Mother    Healthy Father    Seizures Brother     Social History Social History   Tobacco Use   Smoking status: Never    Passive exposure: Never   Smokeless tobacco: Never  Vaping Use   Vaping status: Never Used  Substance Use Topics   Alcohol use: Never   Drug use: Never     Allergies   Patient has no known allergies.   Review of Systems Review of Systems  Constitutional:  Negative for chills and fever.  Musculoskeletal:  Negative for arthralgias and joint swelling.  Skin:  Positive for wound. Negative for color change.  Neurological:  Negative for weakness and numbness.     Physical Exam Triage Vital Signs ED Triage Vitals  Encounter Vitals Group     BP 08/27/24 1910 131/88     Girls Systolic BP Percentile --      Girls Diastolic BP Percentile --      Boys Systolic BP Percentile --      Boys Diastolic BP Percentile --      Pulse Rate 08/27/24 1910 97     Resp 08/27/24 1910 18     Temp 08/27/24 1910 97.9 F (36.6 C)     Temp src --      SpO2 08/27/24 1910 99 %     Weight --      Height --      Head Circumference --      Peak Flow --      Pain Score 08/27/24 1919 4     Pain Loc --      Pain Education --      Exclude from Growth Chart --    No data found.  Updated Vital Signs BP 131/88   Pulse 97   Temp  97.9 F (36.6 C)   Resp 18   LMP 08/10/2024   SpO2 99%   Visual Acuity Right Eye Distance:   Left Eye Distance:   Bilateral Distance:    Right Eye Near:   Left Eye Near:    Bilateral Near:     Physical Exam Constitutional:      General: She is not in acute distress. HENT:     Mouth/Throat:     Mouth: Mucous membranes are moist.  Cardiovascular:     Rate and Rhythm: Normal rate.  Pulmonary:     Effort: Pulmonary effort is normal. No respiratory distress.  Musculoskeletal:        General: No deformity. Normal range of motion.  Skin:    General: Skin is warm and dry.     Capillary Refill: Capillary refill takes less than 2 seconds.     Findings: Lesion present.     Comments: 3 cm laceration on dorsum of left thumb.  Bleeding controlled.  Sensation intact, strength 5/5, FROM, brisk capillary refill.  Neurological:     General: No focal deficit present.     Mental Status: She is alert.     Sensory: No sensory deficit.     Motor: No weakness.      UC Treatments / Results  Labs (all labs ordered are listed, but only abnormal results are displayed) Labs Reviewed - No data to display  EKG   Radiology No results found.  Procedures Laceration Repair  Date/Time: 08/27/2024 7:54 PM  Performed by: Corlis Burnard DEL, NP Authorized by: Corlis Burnard DEL, NP   Consent:    Consent obtained:  Verbal   Consent given by:  Patient   Risks discussed:  Infection, pain, poor cosmetic result and poor wound healing Universal protocol:    Procedure explained and questions answered to patient or proxy's satisfaction: yes   Anesthesia:    Anesthesia method:  Local infiltration   Local anesthetic:  Lidocaine  1% w/o epi Laceration details:    Location:  Finger   Finger location:  L thumb   Length (cm):  3   Depth (mm):  2 Pre-procedure details:    Preparation:  Patient was prepped and draped in usual sterile fashion Exploration:    Hemostasis achieved with:  Direct pressure    Imaging outcome: foreign body not noted  Wound exploration: wound explored through full range of motion and entire depth of wound visualized   Treatment:    Area cleansed with:  Povidone-iodine   Amount of cleaning:  Standard   Irrigation solution:  Sterile water   Irrigation method:  Syringe   Visualized foreign bodies/material removed: no   Skin repair:    Repair method:  Sutures   Suture size:  3-0   Suture material:  Nylon   Suture technique:  Simple interrupted   Number of sutures:  3 Approximation:    Approximation:  Close Repair type:    Repair type:  Simple Post-procedure details:    Dressing:  Antibiotic ointment and non-adherent dressing   Procedure completion:  Tolerated well, no immediate complications  (including critical care time)  Medications Ordered in UC Medications - No data to display  Initial Impression / Assessment and Plan / UC Course  I have reviewed the triage vital signs and the nursing notes.  Pertinent labs & imaging results that were available during my care of the patient were reviewed by me and considered in my medical decision making (see chart for details).    Laceration of left thumb.  3 sutures.  Tetanus is up-to-date.  Wound care instructions and signs of infection discussed.  Treating prophylactically with cephalexin for infection prevention.  Instructed patient to return here for suture removal in 7 to 10 days.  Instructed her to follow-up right away if she notes signs of infection.  Education provided on laceration care.  She agrees to plan of care.  Final Clinical Impressions(s) / UC Diagnoses   Final diagnoses:  Laceration of left thumb without foreign body without damage to nail, initial encounter     Discharge Instructions      Take the cephalexin as directed.    Keep your wound clean and dry.  Wash it gently twice a day with soap and water.  Then apply an antibiotic ointment and nonstick dressing.    Your stitches need to  be taken out in 7 to 10 days.    Follow-up right away if you note signs of infection such as redness, pus, fever.     ED Prescriptions     Medication Sig Dispense Auth. Provider   cephALEXin (KEFLEX) 500 MG capsule Take 1 capsule (500 mg total) by mouth 3 (three) times daily for 5 days. 15 capsule Corlis Burnard DEL, NP      PDMP not reviewed this encounter.   Corlis Burnard DEL, NP 08/27/24 [phone]

## 2024-08-27 NOTE — ED Triage Notes (Signed)
 Patient to Urgent Care with complaints of a laceration to the side of her thumb. Reports the incident occurred at work tonight using a box cutter.   TDAP 06/09/2021.

## 2024-08-27 NOTE — Discharge Instructions (Addendum)
 Take the cephalexin as directed.    Keep your wound clean and dry.  Wash it gently twice a day with soap and water.  Then apply an antibiotic ointment and nonstick dressing.    Your stitches need to be taken out in 7 to 10 days.    Follow-up right away if you note signs of infection such as redness, pus, fever.

## 2024-09-04 ENCOUNTER — Ambulatory Visit
Admission: RE | Admit: 2024-09-04 | Discharge: 2024-09-04 | Disposition: A | Discharge status: Home / Self Care | Source: Ambulatory Visit

## 2024-09-04 DIAGNOSIS — Z4802 Encounter for removal of sutures: Secondary | ICD-10-CM

## 2024-09-04 NOTE — ED Triage Notes (Signed)
 Patient here to have sutures removed from left thumb. Patient voices no other complaints or concerns at this time.

## 2024-09-15 ENCOUNTER — Emergency Department
Admission: EM | Admit: 2024-09-15 | Discharge: 2024-09-15 | Disposition: A | Discharge status: Home / Self Care | Attending: Emergency Medicine | Admitting: Emergency Medicine

## 2024-09-15 ENCOUNTER — Other Ambulatory Visit: Payer: Self-pay

## 2024-09-15 DIAGNOSIS — R112 Nausea with vomiting, unspecified: Secondary | ICD-10-CM | POA: Diagnosis not present

## 2024-09-15 DIAGNOSIS — R1031 Right lower quadrant pain: Secondary | ICD-10-CM | POA: Diagnosis not present

## 2024-09-15 DIAGNOSIS — R6883 Chills (without fever): Secondary | ICD-10-CM | POA: Diagnosis not present

## 2024-09-15 DIAGNOSIS — R109 Unspecified abdominal pain: Secondary | ICD-10-CM | POA: Diagnosis not present

## 2024-09-15 LAB — URINALYSIS, ROUTINE W REFLEX MICROSCOPIC
Bacteria, UA: NONE SEEN
Bilirubin Urine: NEGATIVE
Glucose, UA: NEGATIVE mg/dL
Hgb urine dipstick: NEGATIVE
Ketones, ur: 20 mg/dL — AB
Leukocytes,Ua: NEGATIVE
Nitrite: NEGATIVE
Protein, ur: 30 mg/dL — AB
Specific Gravity, Urine: 1.031 — ABNORMAL HIGH (ref 1.005–1.030)
pH: 5 (ref 5.0–8.0)

## 2024-09-15 LAB — CBC WITH DIFFERENTIAL/PLATELET
Abs Immature Granulocytes: 0.03 K/uL (ref 0.00–0.07)
Basophils Absolute: 0 K/uL (ref 0.0–0.1)
Basophils Relative: 0 %
Eosinophils Absolute: 0 K/uL (ref 0.0–0.5)
Eosinophils Relative: 0 %
HCT: 39.1 % (ref 36.0–46.0)
Hemoglobin: 13.1 g/dL (ref 12.0–15.0)
Immature Granulocytes: 1 %
Lymphocytes Relative: 17 %
Lymphs Abs: 0.9 K/uL (ref 0.7–4.0)
MCH: 29.6 pg (ref 26.0–34.0)
MCHC: 33.5 g/dL (ref 30.0–36.0)
MCV: 88.5 fL (ref 80.0–100.0)
Monocytes Absolute: 0.3 K/uL (ref 0.1–1.0)
Monocytes Relative: 5 %
Neutro Abs: 4.1 K/uL (ref 1.7–7.7)
Neutrophils Relative %: 77 %
Platelets: 160 K/uL (ref 150–400)
RBC: 4.42 MIL/uL (ref 3.87–5.11)
RDW: 12.4 % (ref 11.5–15.5)
WBC: 5.3 K/uL (ref 4.0–10.5)
nRBC: 0 % (ref 0.0–0.2)

## 2024-09-15 LAB — COMPREHENSIVE METABOLIC PANEL WITH GFR
ALT: 11 U/L (ref 0–44)
AST: 19 U/L (ref 15–41)
Albumin: 4.4 g/dL (ref 3.5–5.0)
Alkaline Phosphatase: 52 U/L (ref 38–126)
Anion gap: 17 — ABNORMAL HIGH (ref 5–15)
BUN: 10 mg/dL (ref 6–20)
CO2: 17 mmol/L — ABNORMAL LOW (ref 22–32)
Calcium: 9.1 mg/dL (ref 8.9–10.3)
Chloride: 105 mmol/L (ref 98–111)
Creatinine, Ser: 0.78 mg/dL (ref 0.44–1.00)
GFR, Estimated: >60 mL/min
Glucose, Bld: 82 mg/dL (ref 70–99)
Potassium: 4.1 mmol/L (ref 3.5–5.1)
Sodium: 139 mmol/L (ref 135–145)
Total Bilirubin: 0.8 mg/dL (ref 0.0–1.2)
Total Protein: 6.8 g/dL (ref 6.5–8.1)

## 2024-09-15 LAB — LIPASE, BLOOD: Lipase: 24 U/L (ref 11–51)

## 2024-09-15 LAB — POC URINE PREG, ED: Preg Test, Ur: NEGATIVE

## 2024-09-15 MED ORDER — ONDANSETRON 4 MG PO TBDP: 4.0000 mg | ORAL_TABLET | Freq: Three times a day (TID) | ORAL | 0 refills | Status: AC | PRN

## 2024-09-15 NOTE — ED Notes (Signed)
 Pt verbalizes understanding of discharge instructions. Opportunity for questioning and answers were provided. Pt discharged from ED to home with family.

## 2024-09-15 NOTE — ED Provider Notes (Signed)
 Emergency department handoff note  Care of this patient was signed out to me at the end of the previous provider shift.  All pertinent patient information was conveyed and all questions were answered.  Patient pending urine sample for urinalysis and urine pregnancy both of which have come back negative.  Patient is p.o. tolerant and agrees with plan for discharge at this time with ODT Zofran .  Patient given strict return precautions and all questions answered prior to discharge  Dispo: Discharge home with PCP follow-up as needed   Zowie Lundahl K, MD 09/15/24 [phone]

## 2024-09-15 NOTE — ED Triage Notes (Signed)
 Pt c/o abdominal pain that started yesterday. Sts pain was centralized. No located RLQ radiating into her side/back. Associated with NV, chills, and suspected fevers. At home temp 99.8.

## 2024-09-15 NOTE — ED Notes (Signed)
Pt giving urine sample at this time.

## 2024-09-15 NOTE — ED Provider Notes (Signed)
 "  [facility] Provider Note    Event Date/Time   First MD Initiated Contact with Patient 09/15/24 0500     (approximate)   History   Chief Complaint Abdominal Pain   HPI  Veronica Trevino is a 18 y.o. female with past medical history of seizures who presents to the ED complaining of abdominal pain.  Patient reports that she has had increasing pain in the right side of her abdomen over the past hour and a half.  She describes the pain as sharp and not exacerbated or alleviated by anything in particular.  She has had some nausea and vomiting since onset as well as some chills, but denies fever.  She denies any dysuria or flank pain.  She has never had surgery on her abdomen.     Physical Exam   Triage Vital Signs: ED Triage Vitals  Encounter Vitals Group     BP 09/15/24 0459 (!) 117/53     Girls Systolic BP Percentile --      Girls Diastolic BP Percentile --      Boys Systolic BP Percentile --      Boys Diastolic BP Percentile --      Pulse Rate 09/15/24 0459 97     Resp 09/15/24 0459 15     Temp 09/15/24 0459 98.5 F (36.9 C)     Temp Source 09/15/24 0459 Oral     SpO2 09/15/24 0459 99 %     Weight --      Height --      Head Circumference --      Peak Flow --      Pain Score 09/15/24 0500 7     Pain Loc --      Pain Education --      Exclude from Growth Chart --     Most recent vital signs: Vitals:   09/15/24 0459 09/15/24 0623  BP: (!) 117/53   Pulse: 97   Resp: 15   Temp: 98.5 F (36.9 C)   SpO2: 99% 99%    Constitutional: Alert and oriented. Eyes: Conjunctivae are normal. Head: Atraumatic. Nose: No congestion/rhinnorhea. Mouth/Throat: Mucous membranes are moist.  Cardiovascular: Normal rate, regular rhythm. Grossly normal heart sounds.  2+ radial pulses bilaterally. Respiratory: Normal respiratory effort.  No retractions. Lungs CTAB. Gastrointestinal: Soft and nontender.  No CVA tenderness bilaterally.  No  distention. Musculoskeletal: No lower extremity tenderness nor edema.  Neurologic:  Normal speech and language. No gross focal neurologic deficits are appreciated.    ED Results / Procedures / Treatments   Labs (all labs ordered are listed, but only abnormal results are displayed) Labs Reviewed  COMPREHENSIVE METABOLIC PANEL WITH GFR - Abnormal; Notable for the following components:      Result Value   CO2 17 (*)    Anion gap 17 (*)    All other components within normal limits  CBC WITH DIFFERENTIAL/PLATELET  LIPASE, BLOOD  URINALYSIS, ROUTINE W REFLEX MICROSCOPIC  POC URINE PREG, ED     PROCEDURES:  Critical Care performed: No  Procedures   MEDICATIONS ORDERED IN ED: Medications - No data to display   IMPRESSION / MDM / ASSESSMENT AND PLAN / ED COURSE  I reviewed the triage vital signs and the nursing notes.                              18 y.o. female with past medical history of  seizures who presents to the ED complaining of about an hour and a half of right lower quadrant abdominal pain associated with nausea and vomiting.  Patient's presentation is most consistent with acute presentation with potential threat to life or bodily function.  Differential diagnosis includes, but is not limited to, appendicitis, kidney stone, UTI, ectopic pregnancy, gastroenteritis, dehydration, electrolyte abnormality, AKI.  Patient nontoxic-appearing and in no acute distress, vital signs are unremarkable.  Her abdomen is soft without any tenderness and she also has no CVA tenderness.  We will screen labs but patient and father agreeable with plan to hold off on CT imaging for now given reassuring exam.  Plan to reassess following lab results, patient currently declines pain or nausea medication.  Labs are reassuring with no significant anemia, leukocytosis, electrolyte abnormality, or AKI.  LFTs and lipase are unremarkable, pregnancy testing and urinalysis are pending.  On reassessment,  patient continues to deny significant pain and reexamination of her abdomen is also reassuring.  Patient turned over to oncoming provider pending pregnancy testing and urinalysis results, but patient and family agreeable to plan for discharge home following these results.      FINAL CLINICAL IMPRESSION(S) / ED DIAGNOSES   Final diagnoses:  Right lower quadrant abdominal pain     Rx / DC Orders   ED Discharge Orders     None        Note:  This document was prepared using Dragon voice recognition software and may include unintentional dictation errors.   Willo Dunnings, MD 09/15/24 [phone]  "

## 2024-12-09 ENCOUNTER — Ambulatory Visit: Admitting: General Practice
